# Patient Record
Sex: Male | Born: 1950 | Race: White | Hispanic: No | Marital: Married | Smoking: Never smoker
Health system: Southern US, Community
[De-identification: ages and names within clinical notes are randomized; demographics above are authoritative.]

## PROBLEM LIST (undated history)

## (undated) DIAGNOSIS — M1990 Unspecified osteoarthritis, unspecified site: Secondary | ICD-10-CM

## (undated) DIAGNOSIS — E039 Hypothyroidism, unspecified: Secondary | ICD-10-CM

## (undated) DIAGNOSIS — I10 Essential (primary) hypertension: Secondary | ICD-10-CM

## (undated) DIAGNOSIS — Z974 Presence of external hearing-aid: Secondary | ICD-10-CM

## (undated) DIAGNOSIS — E785 Hyperlipidemia, unspecified: Secondary | ICD-10-CM

## (undated) HISTORY — PX: SKIN CANCER EXCISION: SHX779

## (undated) HISTORY — PX: APPENDECTOMY: SHX54

## (undated) HISTORY — PX: COLONOSCOPY: SHX174

## (undated) HISTORY — PX: BACK SURGERY: SHX140

---

## 2005-05-22 ENCOUNTER — Ambulatory Visit: Payer: Self-pay | Admitting: Unknown Physician Specialty

## 2010-12-06 ENCOUNTER — Ambulatory Visit: Payer: Self-pay | Admitting: Unknown Physician Specialty

## 2013-12-27 ENCOUNTER — Observation Stay: Payer: Self-pay | Admitting: Internal Medicine

## 2013-12-27 LAB — COMPREHENSIVE METABOLIC PANEL
Albumin: 3.6 g/dL (ref 3.4–5.0)
Alkaline Phosphatase: 70 U/L
Anion Gap: 7 (ref 7–16)
BUN: 19 mg/dL — AB (ref 7–18)
Bilirubin,Total: 1.3 mg/dL — ABNORMAL HIGH (ref 0.2–1.0)
CREATININE: 1.38 mg/dL — AB (ref 0.60–1.30)
Calcium, Total: 8.3 mg/dL — ABNORMAL LOW (ref 8.5–10.1)
Chloride: 108 mmol/L — ABNORMAL HIGH (ref 98–107)
Co2: 25 mmol/L (ref 21–32)
EGFR (African American): >60
GFR CALC NON AF AMER: 55 — AB
Glucose: 102 mg/dL — ABNORMAL HIGH (ref 65–99)
OSMOLALITY: 282 (ref 275–301)
Potassium: 3.7 mmol/L (ref 3.5–5.1)
SGOT(AST): 32 U/L (ref 15–37)
SGPT (ALT): 38 U/L
SODIUM: 140 mmol/L (ref 136–145)
Total Protein: 6.8 g/dL (ref 6.4–8.2)

## 2013-12-27 LAB — CBC WITH DIFFERENTIAL/PLATELET
BASOS ABS: 0.1 10*3/uL (ref 0.0–0.1)
Basophil %: 0.7 %
EOS ABS: 0.2 10*3/uL (ref 0.0–0.7)
Eosinophil %: 2.1 %
HCT: 42.5 % (ref 40.0–52.0)
HGB: 14.1 g/dL (ref 13.0–18.0)
LYMPHS ABS: 1.6 10*3/uL (ref 1.0–3.6)
Lymphocyte %: 21.6 %
MCH: 30.9 pg (ref 26.0–34.0)
MCHC: 33.1 g/dL (ref 32.0–36.0)
MCV: 93 fL (ref 80–100)
MONOS PCT: 8.6 %
Monocyte #: 0.6 x10 3/mm (ref 0.2–1.0)
NEUTROS PCT: 67 %
Neutrophil #: 5 10*3/uL (ref 1.4–6.5)
Platelet: 162 10*3/uL (ref 150–440)
RBC: 4.55 10*6/uL (ref 4.40–5.90)
RDW: 13.9 % (ref 11.5–14.5)
WBC: 7.5 10*3/uL (ref 3.8–10.6)

## 2013-12-27 LAB — CK-MB: CK-MB: 5.7 ng/mL — ABNORMAL HIGH (ref 0.5–3.6)

## 2013-12-27 LAB — TROPONIN I: Troponin-I: <0.02 ng/mL

## 2013-12-28 LAB — TROPONIN I: Troponin-I: <0.02 ng/mL

## 2013-12-28 LAB — LIPID PANEL
Cholesterol: 188 mg/dL (ref 0–200)
HDL: 33 mg/dL — AB (ref 40–60)
LDL CHOLESTEROL, CALC: 109 mg/dL — AB (ref 0–100)
Triglycerides: 231 mg/dL — ABNORMAL HIGH (ref 0–200)
VLDL Cholesterol, Calc: 46 mg/dL — ABNORMAL HIGH (ref 5–40)

## 2013-12-28 LAB — CK-MB
CK-MB: 5.2 ng/mL — ABNORMAL HIGH (ref 0.5–3.6)
CK-MB: 5.4 ng/mL — ABNORMAL HIGH (ref 0.5–3.6)

## 2013-12-29 ENCOUNTER — Ambulatory Visit: Payer: Self-pay | Admitting: Cardiology

## 2014-06-13 NOTE — H&P (Signed)
PATIENT NAME:  Don Holder, Don Holder MR#:  [id number] DATE OF BIRTH:  Jan 26, 1951  DATE OF ADMISSION:  12/27/2013  REFERRING PHYSICIAN: Larae Grooms, MD  PRIMARY CARE PHYSICIAN: Rusty Aus, MD, [facility].   CHIEF COMPLAINT: Chest pain.  HISTORY OF PRESENT ILLNESS: A 64 year old Caucasian male with a history of hypertension presenting with chest pain. First episode 1 day prior to arrival. It occurred at rest. Second episode with exertion. Describes both episodes retrosternal location with radiation to the right chest and shoulder, dull pressure in quality, 4 to 5 out of 10 in intensity, associated with shortness of breath. Currently symptom-free at this time. No further symptoms.   REVIEW OF SYSTEMS: CONSTITUTIONAL: Denies fevers, chills, fatigue, weakness.  EYES: Denies blurred vision, double vision, eye pain.  EARS, NOSE, THROAT: Denies tinnitus, ear pain, hearing loss.  RESPIRATORY: Denies cough, wheeze, shortness of breath currently. Did have an episode of shortness of breath with his chest pain.  CARDIOVASCULAR: Chest pain as described above. Denies any orthopnea, edema.  GASTROINTESTINAL: Denies nausea, vomiting, diarrhea, abdominal pain.  GENITOURINARY: Denies dysuria, hematuria.  ENDOCRINE: Denies nocturia or thyroid problems. HEMATOLOGY AND LYMPHATIC: Denies easy bruising and bleeding.  SKIN: Denies rashes or lesions.  MUSCULOSKELETAL: Denies pain in neck, back, shoulder, knees, hips, or arthritic symptoms.  NEUROLOGIC: Denies paralysis, paresthesias.  PSYCHIATRIC: Denies anxiety or depressive symptoms.  Otherwise, full review of systems performed by me is negative.   PAST MEDICAL HISTORY: Hypertension.   SOCIAL HISTORY: Denies any alcohol, tobacco, or drug usage.   FAMILY HISTORY: Positive for coronary artery disease; however, late onset in his father.   ALLERGIES: No known drug allergies.   HOME MEDICATIONS: Include lisinopril 20 mg p.o. q. daily.   PHYSICAL  EXAMINATION:  VITAL SIGNS: Temperature 98.1, heart rate 88, respirations 14, blood pressure 101/72, saturating 96% on room air. Weight 90.1 kg, BMI 30.2.  GENERAL: Well-nourished, well-developed, Caucasian male appearing in no acute distress.  HEAD: Normocephalic, atraumatic.  EYES: Pupils equal, round, reactive to light. Extraocular muscles intact. No scleral icterus.  MOUTH: Moist mucosal membranes. Dentition intact. No abscess noted. EARS, NOSE, THROAT: Clear without exudates. No external lesions.  NECK: Supple. No thyromegaly. No nodules. No JVD.  PULMONARY: Clear to auscultation bilaterally without wheezes, rales, or rhonchi. No use of accessory muscles. Good respiratory effort.  CHEST: Nontender to palpation.  CARDIOVASCULAR: S1, S2, regular rate and rhythm. No murmurs, rubs, or gallops. No edema. Pedal pulses 2+ bilaterally. GASTROINTESTINAL: Soft, nontender, nondistended. No masses. Positive bowel sounds. No hepatosplenomegaly.  MUSCULOSKELETAL: No swelling, clubbing, or edema. Range of motion full in all extremities.  NEUROLOGIC: Cranial nerves II through XII intact. No gross focal neurological deficits. Sensation intact. Reflexes intact.  SKIN: No ulceration, lesions, rash, cyanosis. Skin warm, dry. Turgor intact.  PSYCHIATRIC: Mood and affect within normal limits. Patient awake, alert, oriented x 3. Insight and judgment are intact.   LABORATORY DATA: EKG performed which reveals normal sinus rhythm. No ST or T wave abnormalities. Remainder of laboratory data: Sodium 140, potassium 3.7, chloride 108, bicarbonate 25, BUN 19, creatinine 1.38, glucose 102. LFTs: Bilirubin of 1.3, otherwise within normal limits. Troponin less than 0.02. WBC 7.5, hemoglobin of 14.1, platelets of 162,000.    ASSESSMENT AND PLAN: A 64 year old Caucasian male with a history of hypertension presenting with chest pain.  1.  Chest pain, central. Admitted to telemetry under observational status. Initiate aspirin and  statin therapy. Trend cardiac enzymes x 3.  2.  Hypertension. Lisinopril.  3.  Acute kidney injury. Gentle intravenous fluid hydration. Follow renal function and urine output.  4.  Venous thromboembolism prophylaxis with heparin subcutaneous.  CODE STATUS: Patient is full code.   TIME SPENT: 45 minutes.    ____________________________ Aaron Mose. Jerzie Bieri, MD dkh:ST D: 12/27/2013 23:15:50 ET T: 12/27/2013 23:45:25 ET JOB#: [id number]  cc: Aaron Mose. Everlean Bucher, MD, <Dictator> Murielle Stang Woodfin Ganja MD ELECTRONICALLY SIGNED 12/28/2013 2:20

## 2014-06-13 NOTE — Consult Note (Signed)
   Present Illness 64 yo male with history of htn treated with lisinopril admitted with several day history of right sided chest and arm pain with some doe. He has ruled out for an mi. EKG is normal. States pain began on thrusday of last week and has persisted on and off but mostly on since then. Some doe when ambulating. RF include htn. No dm, tob family hx or lipids.   Physical Exam:  GEN well developed, well nourished, no acute distress   HEENT PERRL, hearing intact to voice   NECK supple   RESP normal resp effort  clear BS   CARD Regular rate and rhythm  Normal, S1, S2  No murmur   ABD denies tenderness  no liver/spleen enlargement   LYMPH negative neck   EXTR negative cyanosis/clubbing, negative edema   SKIN normal to palpation   NEURO cranial nerves intact, motor/sensory function intact   PSYCH A+O to time, place, person   Review of Systems:  Subjective/Chief Complaint chest pain   General: No Complaints   Skin: No Complaints   ENT: No Complaints   Eyes: No Complaints   Neck: No Complaints   Respiratory: No Complaints   Cardiovascular: Chest pain or discomfort  Dyspnea   Genitourinary: No Complaints   Musculoskeletal: No Complaints   Neurologic: No Complaints   Hematologic: No Complaints   Endocrine: No Complaints   Psychiatric: No Complaints   Review of Systems: All other systems were reviewed and found to be negative   Medications/Allergies Reviewed Medications/Allergies reviewed   Family & Social History:  Family and Social History:  Family History Non-Contributory   Social History negative tobacco   Place of Living Home   EKG:  EKG Nml  NSR    Penicillin: Do NOT Use   Impression Pt with htn admitted with atypical chest pain. Ruled out for an mi. EKG and cxr normal. Atypical for angina. WIll ambulate and if stable discharge to home for out patient sestimibi in am.   Plan 1.; Contninue lisinopril and asa 2. OK for discharge form  cardiac standpoint 3. Will set up outpatient sestimibi in am 4. Further recs based on course and sestimibi outcome.   Electronic Signatures: Teodoro Spray (MD)  (Signed [phone] 10:32)  Authored: General Aspect/Present Illness, History and Physical Exam, Review of System, Family & Social History, EKG , Allergies, Impression/Plan   Last Updated: 08-Nov-15 10:32 by Teodoro Spray (MD)

## 2014-06-13 NOTE — Discharge Summary (Signed)
PATIENT NAME:  Don Holder, Don Holder MR#:  [id number] DATE OF BIRTH:  1950/05/10  DATE OF ADMISSION:  12/27/2013 DATE OF DISCHARGE:  12/28/2013  DISCHARGE DIAGNOSES: 1.  Atypical chest pain.  2.  Hypertension.  3.  Acute renal insufficiency.   CHIEF COMPLAINT: Chest pain.   HISTORY OF PRESENT ILLNESS: Don Holder is a 64 year old gentleman with a history of hypertension who presented to the ED complaining of chest pain. The patient reportedly had his first episode a few days back and he described it as a dull pressure-like quality, mainly in the right side of the chest and shoulder. 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 The patient also subsequently developed an episode of shortness of breath when he was walking up hill.   PAST MEDICAL HISTORY: Significant for hypertension. Please see H and P for further details.   HOSPITAL COURSE: The patient was admitted to [facility]. EKG showed normal sinus rhythm with no ST-T changes. Troponin less than 0.02. The patient was chest pain free. He was seen by cardiologist, Dr. Ubaldo Glassing, who recommended that he have a sestamibi Myoview stress test as an outpatient. The patient was ambulated and appeared stable at the time of discharge. Cholesterol level was 188, triglycerides 231 with a troponin less than 0.02, CPK was 5.7 and CPK MB of 5.2. The patient will follow up with Dr. Sabra Heck and also have a sestamibi stress test, that is being as scheduled for him by cardiologist, Dr. Ubaldo Glassing, in a.m.   MEDICATIONS ON DISCHARGE: Lisinopril 20 mg once a day, atorvastatin 20 mg once a day and aspirin 325 mg once a day.   DISCHARGE INSTRUCTIONS: The patient is advised low sodium diet and follow up with Dr. Sabra Heck in 1-2 weeks' time.    Total time spent in discharge of pt:35 minutes  ____________________________ Tracie Harrier, MD vh:TT D: 12/28/2013 11:42:31 ET T: 12/28/2013 15:17:31  ET JOB#: [id number]  cc: Tracie Harrier, MD, <Dictator> Tracie Harrier MD ELECTRONICALLY SIGNED 12/31/2013 13:09

## 2015-07-27 DIAGNOSIS — M25511 Pain in right shoulder: Secondary | ICD-10-CM | POA: Diagnosis not present

## 2015-11-01 DIAGNOSIS — M7521 Bicipital tendinitis, right shoulder: Secondary | ICD-10-CM | POA: Diagnosis not present

## 2015-11-12 DIAGNOSIS — Z85828 Personal history of other malignant neoplasm of skin: Secondary | ICD-10-CM | POA: Diagnosis not present

## 2015-11-12 DIAGNOSIS — L570 Actinic keratosis: Secondary | ICD-10-CM | POA: Diagnosis not present

## 2015-11-12 DIAGNOSIS — X32XXXA Exposure to sunlight, initial encounter: Secondary | ICD-10-CM | POA: Diagnosis not present

## 2015-11-12 DIAGNOSIS — B078 Other viral warts: Secondary | ICD-10-CM | POA: Diagnosis not present

## 2015-11-12 DIAGNOSIS — D1801 Hemangioma of skin and subcutaneous tissue: Secondary | ICD-10-CM | POA: Diagnosis not present

## 2015-11-12 DIAGNOSIS — L814 Other melanin hyperpigmentation: Secondary | ICD-10-CM | POA: Diagnosis not present

## 2015-12-21 IMAGING — CR DG CHEST 1V PORT
1 series · 1 of 1 positions shown · non-contrast
Comparison: None.

CLINICAL DATA: Shortness of breath for 1 week on 2 L oxygen by
nasal cannula. Midsternal chest pain radiating to the right.

EXAM:
PORTABLE CHEST - 1 VIEW

[ap]
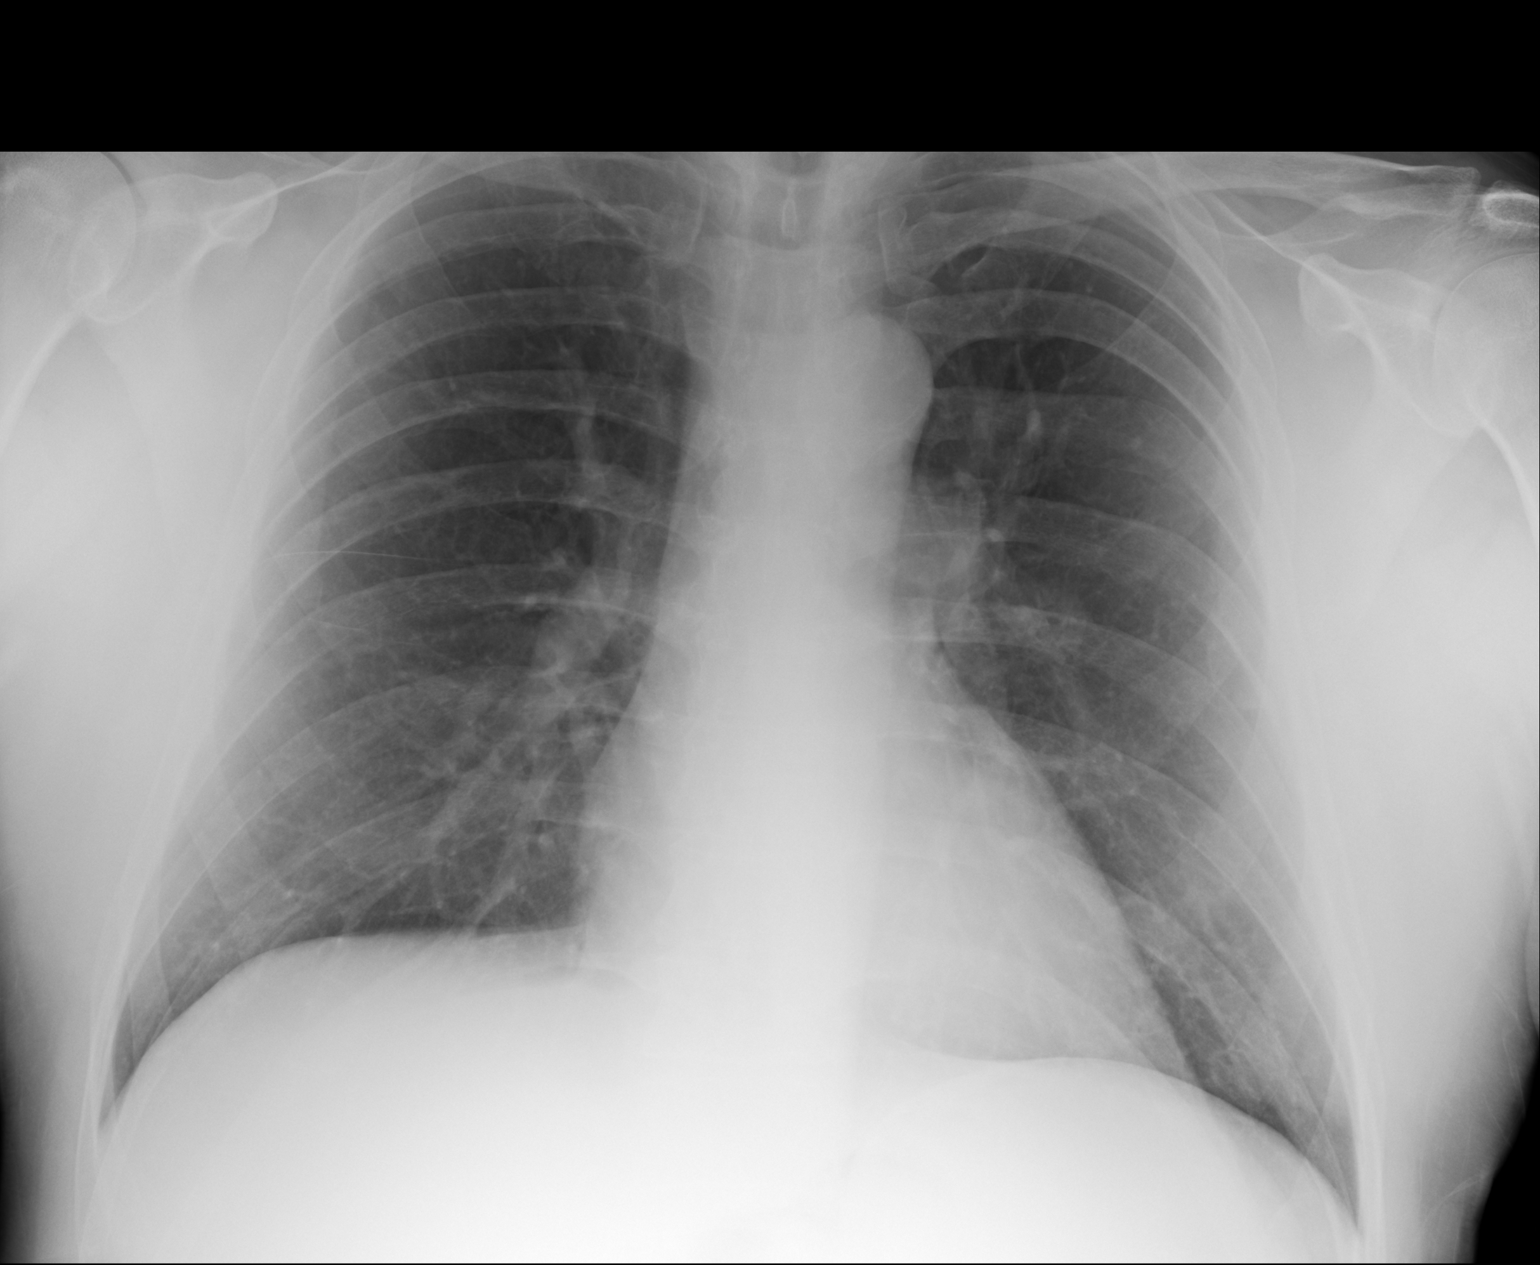

[1 of 1 positions shown; findings below may reference images not displayed]

FINDINGS: The heart size and mediastinal contours are within normal limits.
Both lungs are clear. The visualized skeletal structures are
unremarkable.
IMPRESSION: No active disease.

## 2015-12-29 DIAGNOSIS — M79671 Pain in right foot: Secondary | ICD-10-CM | POA: Diagnosis not present

## 2015-12-29 DIAGNOSIS — M898X9 Other specified disorders of bone, unspecified site: Secondary | ICD-10-CM | POA: Diagnosis not present

## 2015-12-29 DIAGNOSIS — M79672 Pain in left foot: Secondary | ICD-10-CM | POA: Diagnosis not present

## 2015-12-29 DIAGNOSIS — D2370 Other benign neoplasm of skin of unspecified lower limb, including hip: Secondary | ICD-10-CM | POA: Diagnosis not present

## 2016-03-10 DIAGNOSIS — L814 Other melanin hyperpigmentation: Secondary | ICD-10-CM | POA: Diagnosis not present

## 2016-03-10 DIAGNOSIS — L821 Other seborrheic keratosis: Secondary | ICD-10-CM | POA: Diagnosis not present

## 2016-03-10 DIAGNOSIS — Z85828 Personal history of other malignant neoplasm of skin: Secondary | ICD-10-CM | POA: Diagnosis not present

## 2016-03-10 DIAGNOSIS — X32XXXA Exposure to sunlight, initial encounter: Secondary | ICD-10-CM | POA: Diagnosis not present

## 2016-03-10 DIAGNOSIS — L570 Actinic keratosis: Secondary | ICD-10-CM | POA: Diagnosis not present

## 2016-04-26 DIAGNOSIS — M659 Synovitis and tenosynovitis, unspecified: Secondary | ICD-10-CM | POA: Diagnosis not present

## 2016-04-26 DIAGNOSIS — Z0000 Encounter for general adult medical examination without abnormal findings: Secondary | ICD-10-CM | POA: Diagnosis not present

## 2016-04-26 DIAGNOSIS — Z125 Encounter for screening for malignant neoplasm of prostate: Secondary | ICD-10-CM | POA: Diagnosis not present

## 2016-04-26 DIAGNOSIS — E538 Deficiency of other specified B group vitamins: Secondary | ICD-10-CM | POA: Diagnosis not present

## 2016-06-20 DIAGNOSIS — E538 Deficiency of other specified B group vitamins: Secondary | ICD-10-CM | POA: Diagnosis not present

## 2016-07-25 DIAGNOSIS — D485 Neoplasm of uncertain behavior of skin: Secondary | ICD-10-CM | POA: Diagnosis not present

## 2016-07-25 DIAGNOSIS — X32XXXA Exposure to sunlight, initial encounter: Secondary | ICD-10-CM | POA: Diagnosis not present

## 2016-07-25 DIAGNOSIS — Z08 Encounter for follow-up examination after completed treatment for malignant neoplasm: Secondary | ICD-10-CM | POA: Diagnosis not present

## 2016-07-25 DIAGNOSIS — L570 Actinic keratosis: Secondary | ICD-10-CM | POA: Diagnosis not present

## 2016-07-25 DIAGNOSIS — C44612 Basal cell carcinoma of skin of right upper limb, including shoulder: Secondary | ICD-10-CM | POA: Diagnosis not present

## 2016-07-25 DIAGNOSIS — Z85828 Personal history of other malignant neoplasm of skin: Secondary | ICD-10-CM | POA: Diagnosis not present

## 2016-08-21 DIAGNOSIS — E538 Deficiency of other specified B group vitamins: Secondary | ICD-10-CM | POA: Diagnosis not present

## 2016-08-30 DIAGNOSIS — E538 Deficiency of other specified B group vitamins: Secondary | ICD-10-CM | POA: Diagnosis not present

## 2016-09-05 DIAGNOSIS — C44329 Squamous cell carcinoma of skin of other parts of face: Secondary | ICD-10-CM | POA: Diagnosis not present

## 2016-09-05 DIAGNOSIS — D485 Neoplasm of uncertain behavior of skin: Secondary | ICD-10-CM | POA: Diagnosis not present

## 2016-09-05 DIAGNOSIS — L905 Scar conditions and fibrosis of skin: Secondary | ICD-10-CM | POA: Diagnosis not present

## 2016-09-05 DIAGNOSIS — C44612 Basal cell carcinoma of skin of right upper limb, including shoulder: Secondary | ICD-10-CM | POA: Diagnosis not present

## 2016-10-11 DIAGNOSIS — D0439 Carcinoma in situ of skin of other parts of face: Secondary | ICD-10-CM | POA: Diagnosis not present

## 2016-10-11 DIAGNOSIS — C44329 Squamous cell carcinoma of skin of other parts of face: Secondary | ICD-10-CM | POA: Diagnosis not present

## 2016-10-11 DIAGNOSIS — L905 Scar conditions and fibrosis of skin: Secondary | ICD-10-CM | POA: Diagnosis not present

## 2016-10-12 DIAGNOSIS — M7042 Prepatellar bursitis, left knee: Secondary | ICD-10-CM | POA: Diagnosis not present

## 2016-10-30 DIAGNOSIS — M7052 Other bursitis of knee, left knee: Secondary | ICD-10-CM | POA: Diagnosis not present

## 2016-10-30 DIAGNOSIS — I10 Essential (primary) hypertension: Secondary | ICD-10-CM | POA: Diagnosis not present

## 2016-11-15 DIAGNOSIS — M76899 Other specified enthesopathies of unspecified lower limb, excluding foot: Secondary | ICD-10-CM | POA: Diagnosis not present

## 2016-11-15 DIAGNOSIS — I10 Essential (primary) hypertension: Secondary | ICD-10-CM | POA: Diagnosis not present

## 2016-11-15 DIAGNOSIS — Z23 Encounter for immunization: Secondary | ICD-10-CM | POA: Diagnosis not present

## 2016-11-15 DIAGNOSIS — Z0000 Encounter for general adult medical examination without abnormal findings: Secondary | ICD-10-CM | POA: Diagnosis not present

## 2016-11-17 ENCOUNTER — Other Ambulatory Visit: Payer: Self-pay | Admitting: Sports Medicine

## 2016-11-17 DIAGNOSIS — M25462 Effusion, left knee: Secondary | ICD-10-CM

## 2016-11-17 DIAGNOSIS — M25562 Pain in left knee: Principal | ICD-10-CM

## 2016-11-17 DIAGNOSIS — G8929 Other chronic pain: Secondary | ICD-10-CM

## 2016-11-24 ENCOUNTER — Ambulatory Visit
Admission: RE | Admit: 2016-11-24 | Discharge: 2016-11-24 | Disposition: A | Discharge status: Home / Self Care | Payer: PPO | Source: Ambulatory Visit | Attending: Sports Medicine | Admitting: Sports Medicine

## 2016-11-24 ENCOUNTER — Other Ambulatory Visit: Payer: Self-pay | Admitting: Sports Medicine

## 2016-11-24 DIAGNOSIS — G8929 Other chronic pain: Secondary | ICD-10-CM | POA: Insufficient documentation

## 2016-11-24 DIAGNOSIS — M25462 Effusion, left knee: Secondary | ICD-10-CM

## 2016-11-24 DIAGNOSIS — M948X6 Other specified disorders of cartilage, lower leg: Secondary | ICD-10-CM | POA: Insufficient documentation

## 2016-11-24 DIAGNOSIS — S83232A Complex tear of medial meniscus, current injury, left knee, initial encounter: Secondary | ICD-10-CM | POA: Diagnosis not present

## 2016-11-24 DIAGNOSIS — M25562 Pain in left knee: Secondary | ICD-10-CM | POA: Diagnosis not present

## 2016-11-24 DIAGNOSIS — S83242A Other tear of medial meniscus, current injury, left knee, initial encounter: Secondary | ICD-10-CM | POA: Diagnosis not present

## 2017-02-21 DIAGNOSIS — H2513 Age-related nuclear cataract, bilateral: Secondary | ICD-10-CM | POA: Diagnosis not present

## 2017-04-23 DIAGNOSIS — E538 Deficiency of other specified B group vitamins: Secondary | ICD-10-CM | POA: Diagnosis not present

## 2017-04-23 DIAGNOSIS — Z125 Encounter for screening for malignant neoplasm of prostate: Secondary | ICD-10-CM | POA: Diagnosis not present

## 2017-04-23 DIAGNOSIS — Z0000 Encounter for general adult medical examination without abnormal findings: Secondary | ICD-10-CM | POA: Diagnosis not present

## 2017-04-30 DIAGNOSIS — Z79899 Other long term (current) drug therapy: Secondary | ICD-10-CM | POA: Diagnosis not present

## 2017-04-30 DIAGNOSIS — R739 Hyperglycemia, unspecified: Secondary | ICD-10-CM | POA: Diagnosis not present

## 2017-04-30 DIAGNOSIS — Z0000 Encounter for general adult medical examination without abnormal findings: Secondary | ICD-10-CM | POA: Diagnosis not present

## 2017-04-30 DIAGNOSIS — E538 Deficiency of other specified B group vitamins: Secondary | ICD-10-CM | POA: Diagnosis not present

## 2017-04-30 DIAGNOSIS — Z23 Encounter for immunization: Secondary | ICD-10-CM | POA: Diagnosis not present

## 2017-04-30 DIAGNOSIS — Z125 Encounter for screening for malignant neoplasm of prostate: Secondary | ICD-10-CM | POA: Diagnosis not present

## 2017-05-16 DIAGNOSIS — J0100 Acute maxillary sinusitis, unspecified: Secondary | ICD-10-CM | POA: Diagnosis not present

## 2017-06-04 DIAGNOSIS — C44329 Squamous cell carcinoma of skin of other parts of face: Secondary | ICD-10-CM | POA: Diagnosis not present

## 2017-06-04 DIAGNOSIS — D2261 Melanocytic nevi of right upper limb, including shoulder: Secondary | ICD-10-CM | POA: Diagnosis not present

## 2017-06-04 DIAGNOSIS — D2272 Melanocytic nevi of left lower limb, including hip: Secondary | ICD-10-CM | POA: Diagnosis not present

## 2017-06-04 DIAGNOSIS — L821 Other seborrheic keratosis: Secondary | ICD-10-CM | POA: Diagnosis not present

## 2017-06-04 DIAGNOSIS — D2271 Melanocytic nevi of right lower limb, including hip: Secondary | ICD-10-CM | POA: Diagnosis not present

## 2017-06-04 DIAGNOSIS — L570 Actinic keratosis: Secondary | ICD-10-CM | POA: Diagnosis not present

## 2017-06-04 DIAGNOSIS — X32XXXA Exposure to sunlight, initial encounter: Secondary | ICD-10-CM | POA: Diagnosis not present

## 2017-06-04 DIAGNOSIS — B372 Candidiasis of skin and nail: Secondary | ICD-10-CM | POA: Diagnosis not present

## 2017-06-04 DIAGNOSIS — D485 Neoplasm of uncertain behavior of skin: Secondary | ICD-10-CM | POA: Diagnosis not present

## 2017-06-04 DIAGNOSIS — D2262 Melanocytic nevi of left upper limb, including shoulder: Secondary | ICD-10-CM | POA: Diagnosis not present

## 2017-06-04 DIAGNOSIS — D1801 Hemangioma of skin and subcutaneous tissue: Secondary | ICD-10-CM | POA: Diagnosis not present

## 2017-08-02 DIAGNOSIS — C44329 Squamous cell carcinoma of skin of other parts of face: Secondary | ICD-10-CM | POA: Diagnosis not present

## 2017-08-20 DIAGNOSIS — M545 Low back pain: Secondary | ICD-10-CM | POA: Diagnosis not present

## 2017-12-26 DIAGNOSIS — M722 Plantar fascial fibromatosis: Secondary | ICD-10-CM | POA: Diagnosis not present

## 2018-01-14 DIAGNOSIS — X32XXXA Exposure to sunlight, initial encounter: Secondary | ICD-10-CM | POA: Diagnosis not present

## 2018-01-14 DIAGNOSIS — B372 Candidiasis of skin and nail: Secondary | ICD-10-CM | POA: Diagnosis not present

## 2018-01-14 DIAGNOSIS — L738 Other specified follicular disorders: Secondary | ICD-10-CM | POA: Diagnosis not present

## 2018-01-14 DIAGNOSIS — D485 Neoplasm of uncertain behavior of skin: Secondary | ICD-10-CM | POA: Diagnosis not present

## 2018-01-14 DIAGNOSIS — L570 Actinic keratosis: Secondary | ICD-10-CM | POA: Diagnosis not present

## 2018-01-14 DIAGNOSIS — Z08 Encounter for follow-up examination after completed treatment for malignant neoplasm: Secondary | ICD-10-CM | POA: Diagnosis not present

## 2018-01-14 DIAGNOSIS — Z85828 Personal history of other malignant neoplasm of skin: Secondary | ICD-10-CM | POA: Diagnosis not present

## 2018-01-14 DIAGNOSIS — C44519 Basal cell carcinoma of skin of other part of trunk: Secondary | ICD-10-CM | POA: Diagnosis not present

## 2018-01-15 DIAGNOSIS — M722 Plantar fascial fibromatosis: Secondary | ICD-10-CM | POA: Diagnosis not present

## 2018-01-30 DIAGNOSIS — Z23 Encounter for immunization: Secondary | ICD-10-CM | POA: Diagnosis not present

## 2018-02-15 DIAGNOSIS — C44519 Basal cell carcinoma of skin of other part of trunk: Secondary | ICD-10-CM | POA: Diagnosis not present

## 2018-04-03 DIAGNOSIS — S93312A Subluxation of tarsal joint of left foot, initial encounter: Secondary | ICD-10-CM | POA: Diagnosis not present

## 2018-04-24 DIAGNOSIS — S93312A Subluxation of tarsal joint of left foot, initial encounter: Secondary | ICD-10-CM | POA: Diagnosis not present

## 2018-04-24 DIAGNOSIS — M76812 Anterior tibial syndrome, left leg: Secondary | ICD-10-CM | POA: Diagnosis not present

## 2018-04-29 DIAGNOSIS — Z125 Encounter for screening for malignant neoplasm of prostate: Secondary | ICD-10-CM | POA: Diagnosis not present

## 2018-04-29 DIAGNOSIS — R739 Hyperglycemia, unspecified: Secondary | ICD-10-CM | POA: Diagnosis not present

## 2018-04-29 DIAGNOSIS — Z79899 Other long term (current) drug therapy: Secondary | ICD-10-CM | POA: Diagnosis not present

## 2018-04-29 DIAGNOSIS — E538 Deficiency of other specified B group vitamins: Secondary | ICD-10-CM | POA: Diagnosis not present

## 2018-05-06 DIAGNOSIS — R739 Hyperglycemia, unspecified: Secondary | ICD-10-CM | POA: Diagnosis not present

## 2018-05-06 DIAGNOSIS — Z125 Encounter for screening for malignant neoplasm of prostate: Secondary | ICD-10-CM | POA: Diagnosis not present

## 2018-05-06 DIAGNOSIS — Z0000 Encounter for general adult medical examination without abnormal findings: Secondary | ICD-10-CM | POA: Diagnosis not present

## 2018-05-06 DIAGNOSIS — E538 Deficiency of other specified B group vitamins: Secondary | ICD-10-CM | POA: Diagnosis not present

## 2018-05-06 DIAGNOSIS — E782 Mixed hyperlipidemia: Secondary | ICD-10-CM | POA: Diagnosis not present

## 2018-10-30 DIAGNOSIS — I10 Essential (primary) hypertension: Secondary | ICD-10-CM | POA: Diagnosis not present

## 2018-10-30 DIAGNOSIS — R739 Hyperglycemia, unspecified: Secondary | ICD-10-CM | POA: Diagnosis not present

## 2018-11-06 DIAGNOSIS — R739 Hyperglycemia, unspecified: Secondary | ICD-10-CM | POA: Diagnosis not present

## 2018-11-06 DIAGNOSIS — I10 Essential (primary) hypertension: Secondary | ICD-10-CM | POA: Diagnosis not present

## 2018-12-05 DIAGNOSIS — X32XXXA Exposure to sunlight, initial encounter: Secondary | ICD-10-CM | POA: Diagnosis not present

## 2018-12-05 DIAGNOSIS — Z08 Encounter for follow-up examination after completed treatment for malignant neoplasm: Secondary | ICD-10-CM | POA: Diagnosis not present

## 2018-12-05 DIAGNOSIS — L570 Actinic keratosis: Secondary | ICD-10-CM | POA: Diagnosis not present

## 2018-12-05 DIAGNOSIS — Z85828 Personal history of other malignant neoplasm of skin: Secondary | ICD-10-CM | POA: Diagnosis not present

## 2018-12-05 DIAGNOSIS — D485 Neoplasm of uncertain behavior of skin: Secondary | ICD-10-CM | POA: Diagnosis not present

## 2018-12-06 DIAGNOSIS — Z23 Encounter for immunization: Secondary | ICD-10-CM | POA: Diagnosis not present

## 2019-04-21 DIAGNOSIS — H52223 Regular astigmatism, bilateral: Secondary | ICD-10-CM | POA: Diagnosis not present

## 2019-04-21 DIAGNOSIS — H5203 Hypermetropia, bilateral: Secondary | ICD-10-CM | POA: Diagnosis not present

## 2019-04-21 DIAGNOSIS — H524 Presbyopia: Secondary | ICD-10-CM | POA: Diagnosis not present

## 2019-04-21 DIAGNOSIS — H2513 Age-related nuclear cataract, bilateral: Secondary | ICD-10-CM | POA: Diagnosis not present

## 2019-04-30 DIAGNOSIS — E782 Mixed hyperlipidemia: Secondary | ICD-10-CM | POA: Diagnosis not present

## 2019-04-30 DIAGNOSIS — E538 Deficiency of other specified B group vitamins: Secondary | ICD-10-CM | POA: Diagnosis not present

## 2019-04-30 DIAGNOSIS — Z125 Encounter for screening for malignant neoplasm of prostate: Secondary | ICD-10-CM | POA: Diagnosis not present

## 2019-05-07 DIAGNOSIS — Z125 Encounter for screening for malignant neoplasm of prostate: Secondary | ICD-10-CM | POA: Diagnosis not present

## 2019-05-07 DIAGNOSIS — R739 Hyperglycemia, unspecified: Secondary | ICD-10-CM | POA: Diagnosis not present

## 2019-05-07 DIAGNOSIS — Z0000 Encounter for general adult medical examination without abnormal findings: Secondary | ICD-10-CM | POA: Diagnosis not present

## 2019-05-07 DIAGNOSIS — E782 Mixed hyperlipidemia: Secondary | ICD-10-CM | POA: Diagnosis not present

## 2019-05-07 DIAGNOSIS — E538 Deficiency of other specified B group vitamins: Secondary | ICD-10-CM | POA: Diagnosis not present

## 2019-06-02 DIAGNOSIS — S6991XA Unspecified injury of right wrist, hand and finger(s), initial encounter: Secondary | ICD-10-CM | POA: Diagnosis not present

## 2019-06-02 DIAGNOSIS — L089 Local infection of the skin and subcutaneous tissue, unspecified: Secondary | ICD-10-CM | POA: Diagnosis not present

## 2019-06-02 DIAGNOSIS — L03011 Cellulitis of right finger: Secondary | ICD-10-CM | POA: Diagnosis not present

## 2019-06-02 DIAGNOSIS — S61200A Unspecified open wound of right index finger without damage to nail, initial encounter: Secondary | ICD-10-CM | POA: Diagnosis not present

## 2019-06-02 DIAGNOSIS — S61209A Unspecified open wound of unspecified finger without damage to nail, initial encounter: Secondary | ICD-10-CM | POA: Diagnosis not present

## 2019-06-06 DIAGNOSIS — L089 Local infection of the skin and subcutaneous tissue, unspecified: Secondary | ICD-10-CM | POA: Diagnosis not present

## 2019-07-01 DIAGNOSIS — R739 Hyperglycemia, unspecified: Secondary | ICD-10-CM | POA: Diagnosis not present

## 2019-09-01 DIAGNOSIS — M79674 Pain in right toe(s): Secondary | ICD-10-CM | POA: Diagnosis not present

## 2019-09-01 DIAGNOSIS — M898X9 Other specified disorders of bone, unspecified site: Secondary | ICD-10-CM | POA: Diagnosis not present

## 2019-09-01 DIAGNOSIS — M79675 Pain in left toe(s): Secondary | ICD-10-CM | POA: Diagnosis not present

## 2019-09-29 DIAGNOSIS — L988 Other specified disorders of the skin and subcutaneous tissue: Secondary | ICD-10-CM | POA: Diagnosis not present

## 2019-09-29 DIAGNOSIS — D2272 Melanocytic nevi of left lower limb, including hip: Secondary | ICD-10-CM | POA: Diagnosis not present

## 2019-09-29 DIAGNOSIS — Z01810 Encounter for preprocedural cardiovascular examination: Secondary | ICD-10-CM | POA: Diagnosis not present

## 2019-09-29 DIAGNOSIS — D2262 Melanocytic nevi of left upper limb, including shoulder: Secondary | ICD-10-CM | POA: Diagnosis not present

## 2019-09-29 DIAGNOSIS — D2271 Melanocytic nevi of right lower limb, including hip: Secondary | ICD-10-CM | POA: Diagnosis not present

## 2019-09-29 DIAGNOSIS — D2261 Melanocytic nevi of right upper limb, including shoulder: Secondary | ICD-10-CM | POA: Diagnosis not present

## 2019-09-29 DIAGNOSIS — D225 Melanocytic nevi of trunk: Secondary | ICD-10-CM | POA: Diagnosis not present

## 2019-09-29 DIAGNOSIS — Z85828 Personal history of other malignant neoplasm of skin: Secondary | ICD-10-CM | POA: Diagnosis not present

## 2019-09-29 DIAGNOSIS — L570 Actinic keratosis: Secondary | ICD-10-CM | POA: Diagnosis not present

## 2019-09-29 DIAGNOSIS — X32XXXA Exposure to sunlight, initial encounter: Secondary | ICD-10-CM | POA: Diagnosis not present

## 2019-09-29 DIAGNOSIS — I10 Essential (primary) hypertension: Secondary | ICD-10-CM | POA: Diagnosis not present

## 2019-10-28 ENCOUNTER — Other Ambulatory Visit: Payer: PPO

## 2019-10-30 ENCOUNTER — Ambulatory Visit: Admit: 2019-10-30 | Payer: PPO | Admitting: Podiatry

## 2019-10-30 SURGERY — BONE EXCISION
Anesthesia: Choice | Laterality: Bilateral

## 2020-02-26 DIAGNOSIS — M79674 Pain in right toe(s): Secondary | ICD-10-CM | POA: Diagnosis not present

## 2020-02-26 DIAGNOSIS — M79675 Pain in left toe(s): Secondary | ICD-10-CM | POA: Diagnosis not present

## 2020-02-26 DIAGNOSIS — M2041 Other hammer toe(s) (acquired), right foot: Secondary | ICD-10-CM | POA: Diagnosis not present

## 2020-02-26 DIAGNOSIS — M2042 Other hammer toe(s) (acquired), left foot: Secondary | ICD-10-CM | POA: Diagnosis not present

## 2020-02-26 DIAGNOSIS — M898X9 Other specified disorders of bone, unspecified site: Secondary | ICD-10-CM | POA: Diagnosis not present

## 2020-05-03 DIAGNOSIS — R739 Hyperglycemia, unspecified: Secondary | ICD-10-CM | POA: Diagnosis not present

## 2020-05-03 DIAGNOSIS — Z125 Encounter for screening for malignant neoplasm of prostate: Secondary | ICD-10-CM | POA: Diagnosis not present

## 2020-05-03 DIAGNOSIS — E782 Mixed hyperlipidemia: Secondary | ICD-10-CM | POA: Diagnosis not present

## 2020-05-03 DIAGNOSIS — E538 Deficiency of other specified B group vitamins: Secondary | ICD-10-CM | POA: Diagnosis not present

## 2020-05-10 DIAGNOSIS — Z125 Encounter for screening for malignant neoplasm of prostate: Secondary | ICD-10-CM | POA: Diagnosis not present

## 2020-05-10 DIAGNOSIS — E782 Mixed hyperlipidemia: Secondary | ICD-10-CM | POA: Diagnosis not present

## 2020-05-10 DIAGNOSIS — E039 Hypothyroidism, unspecified: Secondary | ICD-10-CM | POA: Diagnosis not present

## 2020-05-10 DIAGNOSIS — Z0000 Encounter for general adult medical examination without abnormal findings: Secondary | ICD-10-CM | POA: Diagnosis not present

## 2020-05-10 DIAGNOSIS — E538 Deficiency of other specified B group vitamins: Secondary | ICD-10-CM | POA: Diagnosis not present

## 2020-05-10 DIAGNOSIS — R739 Hyperglycemia, unspecified: Secondary | ICD-10-CM | POA: Diagnosis not present

## 2020-05-11 DIAGNOSIS — B349 Viral infection, unspecified: Secondary | ICD-10-CM | POA: Diagnosis not present

## 2020-05-11 DIAGNOSIS — J208 Acute bronchitis due to other specified organisms: Secondary | ICD-10-CM | POA: Diagnosis not present

## 2020-05-11 DIAGNOSIS — U071 COVID-19: Secondary | ICD-10-CM | POA: Diagnosis not present

## 2020-05-12 DIAGNOSIS — U071 COVID-19: Secondary | ICD-10-CM | POA: Diagnosis not present

## 2020-05-12 DIAGNOSIS — J208 Acute bronchitis due to other specified organisms: Secondary | ICD-10-CM | POA: Diagnosis not present

## 2020-05-18 DIAGNOSIS — J208 Acute bronchitis due to other specified organisms: Secondary | ICD-10-CM | POA: Diagnosis not present

## 2020-05-18 DIAGNOSIS — U071 COVID-19: Secondary | ICD-10-CM | POA: Diagnosis not present

## 2020-05-18 DIAGNOSIS — I10 Essential (primary) hypertension: Secondary | ICD-10-CM | POA: Diagnosis not present

## 2020-06-28 DIAGNOSIS — D2371 Other benign neoplasm of skin of right lower limb, including hip: Secondary | ICD-10-CM | POA: Diagnosis not present

## 2020-06-28 DIAGNOSIS — C44519 Basal cell carcinoma of skin of other part of trunk: Secondary | ICD-10-CM | POA: Diagnosis not present

## 2020-06-28 DIAGNOSIS — Z85828 Personal history of other malignant neoplasm of skin: Secondary | ICD-10-CM | POA: Diagnosis not present

## 2020-06-28 DIAGNOSIS — X32XXXA Exposure to sunlight, initial encounter: Secondary | ICD-10-CM | POA: Diagnosis not present

## 2020-06-28 DIAGNOSIS — D485 Neoplasm of uncertain behavior of skin: Secondary | ICD-10-CM | POA: Diagnosis not present

## 2020-06-28 DIAGNOSIS — L570 Actinic keratosis: Secondary | ICD-10-CM | POA: Diagnosis not present

## 2020-06-28 DIAGNOSIS — Z08 Encounter for follow-up examination after completed treatment for malignant neoplasm: Secondary | ICD-10-CM | POA: Diagnosis not present

## 2020-07-01 DIAGNOSIS — Z86010 Personal history of colonic polyps: Secondary | ICD-10-CM | POA: Diagnosis not present

## 2020-07-29 DIAGNOSIS — H52223 Regular astigmatism, bilateral: Secondary | ICD-10-CM | POA: Diagnosis not present

## 2020-07-29 DIAGNOSIS — H43812 Vitreous degeneration, left eye: Secondary | ICD-10-CM | POA: Diagnosis not present

## 2020-07-29 DIAGNOSIS — H524 Presbyopia: Secondary | ICD-10-CM | POA: Diagnosis not present

## 2020-07-29 DIAGNOSIS — H2513 Age-related nuclear cataract, bilateral: Secondary | ICD-10-CM | POA: Diagnosis not present

## 2020-07-29 DIAGNOSIS — H02831 Dermatochalasis of right upper eyelid: Secondary | ICD-10-CM | POA: Diagnosis not present

## 2020-07-29 DIAGNOSIS — H53452 Other localized visual field defect, left eye: Secondary | ICD-10-CM | POA: Diagnosis not present

## 2020-07-29 DIAGNOSIS — H02834 Dermatochalasis of left upper eyelid: Secondary | ICD-10-CM | POA: Diagnosis not present

## 2020-07-29 DIAGNOSIS — H5203 Hypermetropia, bilateral: Secondary | ICD-10-CM | POA: Diagnosis not present

## 2020-08-19 DIAGNOSIS — E039 Hypothyroidism, unspecified: Secondary | ICD-10-CM | POA: Diagnosis not present

## 2020-08-19 DIAGNOSIS — C44519 Basal cell carcinoma of skin of other part of trunk: Secondary | ICD-10-CM | POA: Diagnosis not present

## 2020-09-21 DIAGNOSIS — H25013 Cortical age-related cataract, bilateral: Secondary | ICD-10-CM | POA: Diagnosis not present

## 2020-09-21 DIAGNOSIS — H2513 Age-related nuclear cataract, bilateral: Secondary | ICD-10-CM | POA: Diagnosis not present

## 2020-09-21 DIAGNOSIS — H18413 Arcus senilis, bilateral: Secondary | ICD-10-CM | POA: Diagnosis not present

## 2020-09-21 DIAGNOSIS — H25043 Posterior subcapsular polar age-related cataract, bilateral: Secondary | ICD-10-CM | POA: Diagnosis not present

## 2020-09-21 DIAGNOSIS — H2511 Age-related nuclear cataract, right eye: Secondary | ICD-10-CM | POA: Diagnosis not present

## 2020-10-01 ENCOUNTER — Encounter: Payer: Self-pay | Admitting: *Deleted

## 2020-10-04 ENCOUNTER — Encounter
Admission: RE | Disposition: A | Discharge status: Home / Self Care | Payer: Self-pay | Source: Home / Self Care | Attending: Gastroenterology

## 2020-10-04 ENCOUNTER — Other Ambulatory Visit: Payer: Self-pay

## 2020-10-04 ENCOUNTER — Encounter: Payer: Self-pay | Admitting: Anesthesiology

## 2020-10-04 ENCOUNTER — Ambulatory Visit: Payer: PPO | Admitting: Anesthesiology

## 2020-10-04 ENCOUNTER — Ambulatory Visit
Admission: RE | Admit: 2020-10-04 | Discharge: 2020-10-04 | Disposition: A | Discharge status: Home / Self Care | Payer: PPO | Attending: Gastroenterology | Admitting: Gastroenterology

## 2020-10-04 DIAGNOSIS — D123 Benign neoplasm of transverse colon: Secondary | ICD-10-CM | POA: Diagnosis not present

## 2020-10-04 DIAGNOSIS — Z1211 Encounter for screening for malignant neoplasm of colon: Secondary | ICD-10-CM | POA: Insufficient documentation

## 2020-10-04 DIAGNOSIS — Z880 Allergy status to penicillin: Secondary | ICD-10-CM | POA: Insufficient documentation

## 2020-10-04 DIAGNOSIS — Z888 Allergy status to other drugs, medicaments and biological substances status: Secondary | ICD-10-CM | POA: Diagnosis not present

## 2020-10-04 DIAGNOSIS — Z7982 Long term (current) use of aspirin: Secondary | ICD-10-CM | POA: Diagnosis not present

## 2020-10-04 DIAGNOSIS — K640 First degree hemorrhoids: Secondary | ICD-10-CM | POA: Insufficient documentation

## 2020-10-04 DIAGNOSIS — K635 Polyp of colon: Secondary | ICD-10-CM | POA: Diagnosis not present

## 2020-10-04 DIAGNOSIS — I10 Essential (primary) hypertension: Secondary | ICD-10-CM | POA: Diagnosis not present

## 2020-10-04 DIAGNOSIS — Z79899 Other long term (current) drug therapy: Secondary | ICD-10-CM | POA: Diagnosis not present

## 2020-10-04 DIAGNOSIS — E039 Hypothyroidism, unspecified: Secondary | ICD-10-CM | POA: Diagnosis not present

## 2020-10-04 DIAGNOSIS — Z86010 Personal history of colonic polyps: Secondary | ICD-10-CM | POA: Diagnosis not present

## 2020-10-04 DIAGNOSIS — Z79890 Hormone replacement therapy (postmenopausal): Secondary | ICD-10-CM | POA: Insufficient documentation

## 2020-10-04 DIAGNOSIS — E785 Hyperlipidemia, unspecified: Secondary | ICD-10-CM | POA: Diagnosis not present

## 2020-10-04 HISTORY — DX: Hyperlipidemia, unspecified: E78.5

## 2020-10-04 HISTORY — PX: COLONOSCOPY WITH PROPOFOL: SHX5780

## 2020-10-04 HISTORY — DX: Essential (primary) hypertension: I10

## 2020-10-04 HISTORY — DX: Hypothyroidism, unspecified: E03.9

## 2020-10-04 SURGERY — COLONOSCOPY WITH PROPOFOL
Anesthesia: General

## 2020-10-04 MED ORDER — PROPOFOL 500 MG/50ML IV EMUL
INTRAVENOUS | Status: AC
  Filled 2020-10-04: qty 50

## 2020-10-04 MED ORDER — SODIUM CHLORIDE 0.9 % IV SOLN: INTRAVENOUS | Status: DC

## 2020-10-04 MED ORDER — PROPOFOL 500 MG/50ML IV EMUL
INTRAVENOUS | Status: DC | PRN
  Administered 2020-10-04: 150 ug/kg/min via INTRAVENOUS

## 2020-10-04 NOTE — Anesthesia Postprocedure Evaluation (Signed)
Anesthesia Post Note  Patient: Don Holder  Procedure(s) Performed: COLONOSCOPY WITH PROPOFOL  Patient location during evaluation: Phase II Anesthesia Type: General Level of consciousness: awake and alert, awake and oriented Pain management: pain level controlled Vital Signs Assessment: post-procedure vital signs reviewed and stable Respiratory status: spontaneous breathing, nonlabored ventilation and respiratory function stable Cardiovascular status: blood pressure returned to baseline and stable Postop Assessment: no apparent nausea or vomiting Anesthetic complications: no   No notable events documented.   Last Vitals:  Vitals:   10/04/20 1140 10/04/20 1150  BP: 104/67 (!) 127/92  Pulse: 69 63  Resp: 17 14  Temp:    SpO2: 100% 96%    Last Pain:  Vitals:   10/04/20 1150  TempSrc:   PainSc: 0-No pain                 Phill Mutter

## 2020-10-04 NOTE — Interval H&P Note (Signed)
History and Physical Interval Note:  10/04/2020 11:00 AM  Don Holder  has presented today for surgery, with the diagnosis of Miles.  The various methods of treatment have been discussed with the patient and family. After consideration of risks, benefits and other options for treatment, the patient has consented to  Procedure(s): COLONOSCOPY WITH PROPOFOL (N/A) as a surgical intervention.  The patient's history has been reviewed, patient examined, no change in status, stable for surgery.  I have reviewed the patient's chart and labs.  Questions were answered to the patient's satisfaction.     Lesly Rubenstein  Ok to proceed with colonoscopy

## 2020-10-04 NOTE — Op Note (Signed)
[facility] Patient Name: Don Holder Procedure Date: 10/04/2020 11:11 AM MRN: [id number] Account #: [id number] Date of Birth: 04/22/1950 Admit Type: Outpatient Age: 70 Room: [facility] ENDO ROOM 3 Gender: Male Note Status: Finalized Procedure:             Colonoscopy Indications:           Surveillance: Personal history of adenomatous polyps                         on last colonoscopy > 5 years ago Providers:             Andrey Farmer MD, MD Referring MD:          Rusty Aus, MD (Referring MD) Medicines:             Monitored Anesthesia Care Complications:         No immediate complications. Estimated blood loss:                         Minimal. Procedure:             Pre-Anesthesia Assessment:                        - Prior to the procedure, a History and Physical was                         performed, and patient medications and allergies were                         reviewed. The patient is competent. The risks and                         benefits of the procedure and the sedation options and                         risks were discussed with the patient. All questions                         were answered and informed consent was obtained.                         Patient identification and proposed procedure were                         verified by the physician, the nurse, the anesthetist                         and the technician in the endoscopy suite. Mental                         Status Examination: alert and oriented. Airway                         Examination: normal oropharyngeal airway and neck                         mobility. Respiratory Examination: clear to  auscultation. CV Examination: normal. Prophylactic                         Antibiotics: The patient does not require prophylactic                         antibiotics. Prior Anticoagulants: The patient has                         taken no previous  anticoagulant or antiplatelet                         agents. ASA Grade Assessment: II - A patient with mild                         systemic disease. After reviewing the risks and                         benefits, the patient was deemed in satisfactory                         condition to undergo the procedure. The anesthesia                         plan was to use monitored anesthesia care (MAC).                         Immediately prior to administration of medications,                         the patient was re-assessed for adequacy to receive                         sedatives. The heart rate, respiratory rate, oxygen                         saturations, blood pressure, adequacy of pulmonary                         ventilation, and response to care were monitored                         throughout the procedure. The physical status of the                         patient was re-assessed after the procedure.                        After obtaining informed consent, the colonoscope was                         passed under direct vision. Throughout the procedure,                         the patient's blood pressure, pulse, and oxygen                         saturations were monitored continuously. The  Colonoscope was introduced through the anus and                         advanced to the the cecum, identified by appendiceal                         orifice and ileocecal valve. The colonoscopy was                         performed without difficulty. The patient tolerated                         the procedure well. The quality of the bowel                         preparation was excellent. Findings:      The perianal and digital rectal examinations were normal.      A 1 mm polyp was found in the hepatic flexure. The polyp was sessile.       The polyp was removed with a jumbo cold forceps. Resection and retrieval       were complete. Estimated blood loss was minimal.       Internal hemorrhoids were found during retroflexion. The hemorrhoids       were Grade I (internal hemorrhoids that do not prolapse).      The exam was otherwise without abnormality on direct and retroflexion       views. Impression:            - One 1 mm polyp at the hepatic flexure, removed with                         a jumbo cold forceps. Resected and retrieved.                        - Internal hemorrhoids.                        - The examination was otherwise normal on direct and                         retroflexion views. Recommendation:        - Discharge patient to home.                        - Resume previous diet.                        - Continue present medications.                        - Await pathology results.                        - Repeat colonoscopy in 10 years for surveillance.                        - Return to referring physician as previously                         scheduled. Procedure Code(s):     --- Professional ---  45380, Colonoscopy, flexible; with biopsy, single or                         multiple Diagnosis Code(s):     --- Professional ---                        Z86.010, Personal history of colonic polyps                        K63.5, Polyp of colon                        K64.0, First degree hemorrhoids CPT copyright 2019 American Medical Association. All rights reserved. The codes documented in this report are preliminary and upon coder review may  be revised to meet current compliance requirements. Andrey Farmer MD, MD 10/04/2020 11:33:57 AM Number of Addenda: 0 Note Initiated On: 10/04/2020 11:11 AM Scope Withdrawal Time: 0 hours 8 minutes 9 seconds  Total Procedure Duration: 0 hours 13 minutes 4 seconds  Estimated Blood Loss:  Estimated blood loss was minimal.      [facility]

## 2020-10-04 NOTE — H&P (Signed)
Outpatient short stay form Pre-procedure 10/04/2020  Lesly Rubenstein, MD  Primary Physician: Rusty Aus, MD  Reason for visit:  Surveillance colonoscopy  History of present illness:   70 y/o gentleman with history of polyp > 10 years ago here for surveillance colonoscopy. No blood thinners. History of appendectomy. No family history of GI malignancies.    Current Facility-Administered Medications:    0.9 %  sodium chloride infusion, , Intravenous, Continuous, Sandy Blouch, Hilton Cork, MD  Medications Prior to Admission  Medication Sig Dispense Refill Last Dose   amLODipine (NORVASC) 5 MG tablet Take 5 mg by mouth daily.   10/03/2020   aspirin 81 MG chewable tablet Chew by mouth daily.   10/03/2020   Cyanocobalamin (VITAMIN B 12) 500 MCG TABS Take 1,000 mcg by mouth.   Past Week   levothyroxine (SYNTHROID) 75 MCG tablet Take 75 mcg by mouth daily before breakfast.   10/04/2020   Multiple Vitamin (MULTIVITAMIN) capsule Take 1 capsule by mouth daily.   Past Week   olmesartan (BENICAR) 20 MG tablet Take 20 mg by mouth daily.   10/03/2020   vitamin C (ASCORBIC ACID) 500 MG tablet Take 500 mg by mouth daily.   Past Week     Allergies  Allergen Reactions   Niacin And Related    Penicillins      Past Medical History:  Diagnosis Date   Hyperlipidemia    Hypertension    Hypothyroidism     Review of systems:  Otherwise negative.    Physical Exam  Gen: Alert, oriented. Appears stated age.  HEENT: PERRLA. Lungs: No respiratory distress CV: RRR Abd: soft, benign, no masses Ext: No edema    Planned procedures: Proceed with colonoscopy. The patient understands the nature of the planned procedure, indications, risks, alternatives and potential complications including but not limited to bleeding, infection, perforation, damage to internal organs and possible oversedation/side effects from anesthesia. The patient agrees and gives consent to proceed.  Please refer to procedure notes  for findings, recommendations and patient disposition/instructions.     Lesly Rubenstein, MD [facility]

## 2020-10-04 NOTE — Transfer of Care (Signed)
Immediate Anesthesia Transfer of Care Note  Patient: Don Holder  Procedure(s) Performed: COLONOSCOPY WITH PROPOFOL  Patient Location: PACU  Anesthesia Type:General  Level of Consciousness: awake and sedated  Airway & Oxygen Therapy: Patient Spontanous Breathing and Patient connected to nasal cannula oxygen  Post-op Assessment: Report given to RN and Post -op Vital signs reviewed and stable  Post vital signs: Reviewed and stable  Last Vitals:  Vitals Value Taken Time  BP    Temp    Pulse    Resp    SpO2      Last Pain:  Vitals:   10/04/20 1050  TempSrc: Temporal  PainSc: 0-No pain         Complications: No notable events documented.

## 2020-10-04 NOTE — Anesthesia Procedure Notes (Signed)
Date/Time: 10/04/2020 11:20 AM Performed by: Vaughan Sine Pre-anesthesia Checklist: Patient identified, Emergency Drugs available, Suction available, Patient being monitored and Timeout performed Patient Re-evaluated:Patient Re-evaluated prior to induction Oxygen Delivery Method: Simple face mask Preoxygenation: Pre-oxygenation with 100% oxygen Induction Type: IV induction Placement Confirmation: positive ETCO2 and CO2 detector

## 2020-10-04 NOTE — Anesthesia Preprocedure Evaluation (Signed)
Anesthesia Evaluation  Patient identified by MRN, date of birth, ID band Patient awake    Reviewed: Allergy & Precautions, NPO status , Patient's Chart, lab work & pertinent test results  Airway Mallampati: III  TM Distance: >3 FB Neck ROM: Full    Dental no notable dental hx.    Pulmonary neg pulmonary ROS,    Pulmonary exam normal        Cardiovascular hypertension, Pt. on medications negative cardio ROS Normal cardiovascular exam     Neuro/Psych negative neurological ROS  negative psych ROS   GI/Hepatic negative GI ROS, Neg liver ROS, Bowel prep,  Endo/Other  Hypothyroidism   Renal/GU negative Renal ROS  negative genitourinary   Musculoskeletal negative musculoskeletal ROS (+)   Abdominal (+) + obese,   Peds negative pediatric ROS (+)  Hematology negative hematology ROS (+)   Anesthesia Other Findings . Hyperlipidemia  . Hyperlipidemia, mixed 05/06/2018  . Hypertension  Hypothyroid   Reproductive/Obstetrics negative OB ROS                            Anesthesia Physical Anesthesia Plan  ASA: 3  Anesthesia Plan: General   Post-op Pain Management:    Induction: Intravenous  PONV Risk Score and Plan: 2 and Propofol infusion and TIVA  Airway Management Planned: Nasal Cannula  Additional Equipment:   Intra-op Plan:   Post-operative Plan:   Informed Consent: I have reviewed the patients History and Physical, chart, labs and discussed the procedure including the risks, benefits and alternatives for the proposed anesthesia with the patient or authorized representative who has indicated his/her understanding and acceptance.       Plan Discussed with: CRNA, Anesthesiologist and Surgeon  Anesthesia Plan Comments:         Anesthesia Quick Evaluation

## 2020-10-05 ENCOUNTER — Encounter: Payer: Self-pay | Admitting: Gastroenterology

## 2020-10-06 LAB — SURGICAL PATHOLOGY

## 2020-10-14 DIAGNOSIS — M79675 Pain in left toe(s): Secondary | ICD-10-CM | POA: Diagnosis not present

## 2020-10-14 DIAGNOSIS — M2041 Other hammer toe(s) (acquired), right foot: Secondary | ICD-10-CM | POA: Diagnosis not present

## 2020-10-14 DIAGNOSIS — M898X9 Other specified disorders of bone, unspecified site: Secondary | ICD-10-CM | POA: Diagnosis not present

## 2020-10-14 DIAGNOSIS — M79674 Pain in right toe(s): Secondary | ICD-10-CM | POA: Diagnosis not present

## 2020-10-14 DIAGNOSIS — M2042 Other hammer toe(s) (acquired), left foot: Secondary | ICD-10-CM | POA: Diagnosis not present

## 2020-11-16 DIAGNOSIS — J40 Bronchitis, not specified as acute or chronic: Secondary | ICD-10-CM | POA: Diagnosis not present

## 2020-11-16 DIAGNOSIS — Z20822 Contact with and (suspected) exposure to covid-19: Secondary | ICD-10-CM | POA: Diagnosis not present

## 2020-11-16 DIAGNOSIS — H6692 Otitis media, unspecified, left ear: Secondary | ICD-10-CM | POA: Diagnosis not present

## 2020-11-22 DIAGNOSIS — Z23 Encounter for immunization: Secondary | ICD-10-CM | POA: Diagnosis not present

## 2020-11-22 DIAGNOSIS — H6592 Unspecified nonsuppurative otitis media, left ear: Secondary | ICD-10-CM | POA: Diagnosis not present

## 2020-11-22 DIAGNOSIS — I10 Essential (primary) hypertension: Secondary | ICD-10-CM | POA: Diagnosis not present

## 2020-12-06 DIAGNOSIS — H52201 Unspecified astigmatism, right eye: Secondary | ICD-10-CM | POA: Diagnosis not present

## 2020-12-06 DIAGNOSIS — H2511 Age-related nuclear cataract, right eye: Secondary | ICD-10-CM | POA: Diagnosis not present

## 2020-12-06 DIAGNOSIS — H25011 Cortical age-related cataract, right eye: Secondary | ICD-10-CM | POA: Diagnosis not present

## 2020-12-07 DIAGNOSIS — H2512 Age-related nuclear cataract, left eye: Secondary | ICD-10-CM | POA: Diagnosis not present

## 2020-12-16 DIAGNOSIS — H903 Sensorineural hearing loss, bilateral: Secondary | ICD-10-CM | POA: Diagnosis not present

## 2020-12-16 DIAGNOSIS — H6122 Impacted cerumen, left ear: Secondary | ICD-10-CM | POA: Diagnosis not present

## 2020-12-16 DIAGNOSIS — H9122 Sudden idiopathic hearing loss, left ear: Secondary | ICD-10-CM | POA: Diagnosis not present

## 2020-12-20 DIAGNOSIS — H25012 Cortical age-related cataract, left eye: Secondary | ICD-10-CM | POA: Diagnosis not present

## 2020-12-20 DIAGNOSIS — H2512 Age-related nuclear cataract, left eye: Secondary | ICD-10-CM | POA: Diagnosis not present

## 2020-12-20 DIAGNOSIS — H52202 Unspecified astigmatism, left eye: Secondary | ICD-10-CM | POA: Diagnosis not present

## 2021-01-10 DIAGNOSIS — H9122 Sudden idiopathic hearing loss, left ear: Secondary | ICD-10-CM | POA: Diagnosis not present

## 2021-01-10 DIAGNOSIS — H903 Sensorineural hearing loss, bilateral: Secondary | ICD-10-CM | POA: Diagnosis not present

## 2021-02-02 DIAGNOSIS — D045 Carcinoma in situ of skin of trunk: Secondary | ICD-10-CM | POA: Diagnosis not present

## 2021-02-02 DIAGNOSIS — D2262 Melanocytic nevi of left upper limb, including shoulder: Secondary | ICD-10-CM | POA: Diagnosis not present

## 2021-02-02 DIAGNOSIS — C44519 Basal cell carcinoma of skin of other part of trunk: Secondary | ICD-10-CM | POA: Diagnosis not present

## 2021-02-02 DIAGNOSIS — L821 Other seborrheic keratosis: Secondary | ICD-10-CM | POA: Diagnosis not present

## 2021-02-02 DIAGNOSIS — L570 Actinic keratosis: Secondary | ICD-10-CM | POA: Diagnosis not present

## 2021-02-02 DIAGNOSIS — Z85828 Personal history of other malignant neoplasm of skin: Secondary | ICD-10-CM | POA: Diagnosis not present

## 2021-02-02 DIAGNOSIS — D485 Neoplasm of uncertain behavior of skin: Secondary | ICD-10-CM | POA: Diagnosis not present

## 2021-02-02 DIAGNOSIS — C44619 Basal cell carcinoma of skin of left upper limb, including shoulder: Secondary | ICD-10-CM | POA: Diagnosis not present

## 2021-02-02 DIAGNOSIS — D2261 Melanocytic nevi of right upper limb, including shoulder: Secondary | ICD-10-CM | POA: Diagnosis not present

## 2021-02-02 DIAGNOSIS — C44529 Squamous cell carcinoma of skin of other part of trunk: Secondary | ICD-10-CM | POA: Diagnosis not present

## 2021-02-02 DIAGNOSIS — X32XXXA Exposure to sunlight, initial encounter: Secondary | ICD-10-CM | POA: Diagnosis not present

## 2021-02-02 DIAGNOSIS — D2272 Melanocytic nevi of left lower limb, including hip: Secondary | ICD-10-CM | POA: Diagnosis not present

## 2021-03-03 ENCOUNTER — Other Ambulatory Visit: Payer: Self-pay | Admitting: Podiatry

## 2021-03-03 DIAGNOSIS — M2041 Other hammer toe(s) (acquired), right foot: Secondary | ICD-10-CM | POA: Diagnosis not present

## 2021-03-03 DIAGNOSIS — M2042 Other hammer toe(s) (acquired), left foot: Secondary | ICD-10-CM | POA: Diagnosis not present

## 2021-03-03 DIAGNOSIS — M79675 Pain in left toe(s): Secondary | ICD-10-CM | POA: Diagnosis not present

## 2021-03-03 DIAGNOSIS — M79674 Pain in right toe(s): Secondary | ICD-10-CM | POA: Diagnosis not present

## 2021-03-08 ENCOUNTER — Encounter: Payer: Self-pay | Admitting: Podiatry

## 2021-03-22 NOTE — Discharge Instructions (Addendum)
[facility]  POST OPERATIVE INSTRUCTIONS FOR DR. FOWLER AND [facility]   Take your medication as prescribed.  Pain medication should be taken only as needed.  Keep the dressing clean, dry and intact.  Keep your foot elevated above the heart level for the first 48 hours.  Walking to the bathroom and brief periods of walking are acceptable, unless we have instructed you to be non-weight bearing.  Always wear your post-op shoe when walking.  Always use your crutches if you are to be non-weight bearing.  Do not take a shower. Baths are permissible as long as the foot is kept out of the water.   Every hour you are awake:  Bend your knee 15 times. Flex foot 15 times Massage calf 15 times  Call [facility] ([phone]) if any of the following problems occur: You develop a temperature or fever. The bandage becomes saturated with blood. Medication does not stop your pain. Injury of the foot occurs. Any symptoms of infection including redness, odor, or red streaks running from wound.  Information for Discharge Teaching: EXPAREL (bupivacaine liposome injectable suspension)   Your surgeon or anesthesiologist gave you EXPAREL(bupivacaine) to help control your pain after surgery.  EXPAREL is a local anesthetic that provides pain relief by numbing the tissue around the surgical site. EXPAREL is designed to release pain medication over time and can control pain for up to 72 hours. Depending on how you respond to EXPAREL, you may require less pain medication during your recovery.  Possible side effects: Temporary loss of sensation or ability to move in the area where bupivacaine was injected. Nausea, vomiting, constipation Rarely, numbness and tingling in your mouth or lips, lightheadedness, or anxiety may occur. Call your doctor right away if you think you may be experiencing any of these sensations, or if  you have other questions regarding possible side effects.  Follow all other discharge instructions given to you by your surgeon or nurse. Eat a healthy diet and drink plenty of water or other fluids.  If you return to the hospital for any reason within 96 hours following the administration of EXPAREL, it is important for health care providers to know that you have received this anesthetic. A teal colored band has been placed on your arm with the date, time and amount of EXPAREL you have received in order to alert and inform your health care providers. Please leave this armband in place for the full 96 hours following administration, and then you may remove the band.   

## 2021-03-23 ENCOUNTER — Ambulatory Visit: Payer: Self-pay

## 2021-03-23 ENCOUNTER — Other Ambulatory Visit: Payer: Self-pay

## 2021-03-23 ENCOUNTER — Encounter
Admission: RE | Disposition: A | Discharge status: Home / Self Care | Payer: Self-pay | Source: Home / Self Care | Attending: Podiatry

## 2021-03-23 ENCOUNTER — Encounter: Payer: Self-pay | Admitting: Podiatry

## 2021-03-23 ENCOUNTER — Ambulatory Visit: Payer: PPO | Admitting: Anesthesiology

## 2021-03-23 ENCOUNTER — Ambulatory Visit
Admission: RE | Admit: 2021-03-23 | Discharge: 2021-03-23 | Disposition: A | Discharge status: Home / Self Care | Payer: PPO | Attending: Podiatry | Admitting: Podiatry

## 2021-03-23 DIAGNOSIS — E039 Hypothyroidism, unspecified: Secondary | ICD-10-CM | POA: Diagnosis not present

## 2021-03-23 DIAGNOSIS — M2041 Other hammer toe(s) (acquired), right foot: Secondary | ICD-10-CM | POA: Diagnosis not present

## 2021-03-23 DIAGNOSIS — M898X7 Other specified disorders of bone, ankle and foot: Secondary | ICD-10-CM | POA: Diagnosis not present

## 2021-03-23 DIAGNOSIS — M79675 Pain in left toe(s): Secondary | ICD-10-CM | POA: Diagnosis not present

## 2021-03-23 DIAGNOSIS — M899 Disorder of bone, unspecified: Secondary | ICD-10-CM | POA: Diagnosis not present

## 2021-03-23 DIAGNOSIS — M79674 Pain in right toe(s): Secondary | ICD-10-CM | POA: Diagnosis not present

## 2021-03-23 DIAGNOSIS — M2042 Other hammer toe(s) (acquired), left foot: Secondary | ICD-10-CM | POA: Insufficient documentation

## 2021-03-23 DIAGNOSIS — Z79899 Other long term (current) drug therapy: Secondary | ICD-10-CM | POA: Diagnosis not present

## 2021-03-23 DIAGNOSIS — I10 Essential (primary) hypertension: Secondary | ICD-10-CM | POA: Diagnosis not present

## 2021-03-23 HISTORY — PX: EXCISION PARTIAL PHALANX: SHX6617

## 2021-03-23 HISTORY — PX: HAMMER TOE SURGERY: SHX385

## 2021-03-23 HISTORY — DX: Unspecified osteoarthritis, unspecified site: M19.90

## 2021-03-23 HISTORY — DX: Presence of external hearing-aid: Z97.4

## 2021-03-23 SURGERY — EXCISION, PHALANX, PARTIAL
Anesthesia: General | Site: Toe | Laterality: Bilateral

## 2021-03-23 MED ORDER — OXYCODONE HCL 5 MG/5ML PO SOLN: 5.0000 mg | Freq: Once | ORAL | Status: DC | PRN

## 2021-03-23 MED ORDER — MIDAZOLAM HCL 5 MG/5ML IJ SOLN
INTRAMUSCULAR | Status: DC | PRN
  Administered 2021-03-23: 2 mg via INTRAVENOUS

## 2021-03-23 MED ORDER — BUPIVACAINE HCL (PF) 0.25 % IJ SOLN
INTRAMUSCULAR | Status: DC | PRN
  Administered 2021-03-23: 10 mL via INTRA_ARTICULAR

## 2021-03-23 MED ORDER — LIDOCAINE HCL (CARDIAC) PF 100 MG/5ML IV SOSY
PREFILLED_SYRINGE | INTRAVENOUS | Status: DC | PRN
  Administered 2021-03-23: 40 mg via INTRATRACHEAL

## 2021-03-23 MED ORDER — ONDANSETRON HCL 4 MG/2ML IJ SOLN
INTRAMUSCULAR | Status: DC | PRN
  Administered 2021-03-23: 4 mg via INTRAVENOUS

## 2021-03-23 MED ORDER — PROPOFOL 10 MG/ML IV BOLUS
INTRAVENOUS | Status: DC | PRN
  Administered 2021-03-23: 40 mg via INTRAVENOUS
  Administered 2021-03-23: 140 mg via INTRAVENOUS

## 2021-03-23 MED ORDER — KETOROLAC TROMETHAMINE 30 MG/ML IJ SOLN: 15.0000 mg | Freq: Once | INTRAMUSCULAR | Status: DC | PRN

## 2021-03-23 MED ORDER — FENTANYL CITRATE PF 50 MCG/ML IJ SOSY: 25.0000 ug | PREFILLED_SYRINGE | INTRAMUSCULAR | Status: DC | PRN

## 2021-03-23 MED ORDER — OXYCODONE-ACETAMINOPHEN 5-325 MG PO TABS: 1.0000 | ORAL_TABLET | Freq: Four times a day (QID) | ORAL | 0 refills | Status: DC | PRN

## 2021-03-23 MED ORDER — BUPIVACAINE LIPOSOME 1.3 % IJ SUSP
INTRAMUSCULAR | Status: DC | PRN
  Administered 2021-03-23: 10 mL

## 2021-03-23 MED ORDER — DEXAMETHASONE SODIUM PHOSPHATE 4 MG/ML IJ SOLN
INTRAMUSCULAR | Status: DC | PRN
Start: 2021-03-23 — End: 2021-03-23
  Administered 2021-03-23: 4 mg via INTRAVENOUS

## 2021-03-23 MED ORDER — GLYCOPYRROLATE 0.2 MG/ML IJ SOLN
INTRAMUSCULAR | Status: DC | PRN
Start: 2021-03-23 — End: 2021-03-23
  Administered 2021-03-23: .1 mg via INTRAVENOUS

## 2021-03-23 MED ORDER — LACTATED RINGERS IV SOLN: INTRAVENOUS | Status: DC

## 2021-03-23 MED ORDER — ONDANSETRON HCL 4 MG/2ML IJ SOLN: 4.0000 mg | Freq: Once | INTRAMUSCULAR | Status: DC | PRN

## 2021-03-23 MED ORDER — OXYCODONE HCL 5 MG PO TABS: 5.0000 mg | ORAL_TABLET | Freq: Once | ORAL | Status: DC | PRN

## 2021-03-23 MED ORDER — CLINDAMYCIN PHOSPHATE 900 MG/50ML IV SOLN
900.0000 mg | INTRAVENOUS | Status: AC
  Administered 2021-03-23: 900 mg via INTRAVENOUS

## 2021-03-23 MED ORDER — EPHEDRINE SULFATE (PRESSORS) 50 MG/ML IJ SOLN
INTRAMUSCULAR | Status: DC | PRN
Start: 2021-03-23 — End: 2021-03-23
  Administered 2021-03-23: 5 mg via INTRAVENOUS
  Administered 2021-03-23 (×2): 10 mg via INTRAVENOUS
  Administered 2021-03-23: 5 mg via INTRAVENOUS
  Administered 2021-03-23 (×2): 10 mg via INTRAVENOUS

## 2021-03-23 MED ORDER — SCOPOLAMINE 1 MG/3DAYS TD PT72
MEDICATED_PATCH | TRANSDERMAL | Status: AC
  Filled 2021-03-23: qty 1

## 2021-03-23 MED ORDER — FENTANYL CITRATE (PF) 100 MCG/2ML IJ SOLN
INTRAMUSCULAR | Status: DC | PRN
  Administered 2021-03-23: 25 ug via INTRAVENOUS

## 2021-03-23 SURGICAL SUPPLY — 30 items
APL SKNCLS STERI-STRIP NONHPOA (GAUZE/BANDAGES/DRESSINGS) ×1
BENZOIN TINCTURE PRP APPL 2/3 (GAUZE/BANDAGES/DRESSINGS) ×2 IMPLANT
BLADE OSC/SAGITTAL MD 5.5X18 (BLADE) ×1 IMPLANT
BLADE SURG 15 STRL LF DISP TIS (BLADE) IMPLANT
BLADE SURG 15 STRL SS (BLADE) ×2
BNDG CMPR 75X41 PLY HI ABS (GAUZE/BANDAGES/DRESSINGS) ×1
BNDG ESMARK 4X12 TAN STRL LF (GAUZE/BANDAGES/DRESSINGS) ×2 IMPLANT
BNDG GAUZE ELAST 4 BULKY (GAUZE/BANDAGES/DRESSINGS) ×2 IMPLANT
BNDG STRETCH 4X75 STRL LF (GAUZE/BANDAGES/DRESSINGS) ×2 IMPLANT
CANISTER SUCT 1200ML W/VALVE (MISCELLANEOUS) ×2 IMPLANT
COVER LIGHT HANDLE UNIVERSAL (MISCELLANEOUS) ×4 IMPLANT
CUFF TOURN SGL QUICK 18X4 (TOURNIQUET CUFF) IMPLANT
DRAPE FLUOR MINI C-ARM 54X84 (DRAPES) ×2 IMPLANT
DURAPREP 26ML APPLICATOR (WOUND CARE) ×2 IMPLANT
ELECT REM PT RETURN 9FT ADLT (ELECTROSURGICAL) ×2
ELECTRODE REM PT RTRN 9FT ADLT (ELECTROSURGICAL) ×1 IMPLANT
GAUZE SPONGE 4X4 12PLY STRL (GAUZE/BANDAGES/DRESSINGS) ×2 IMPLANT
GAUZE XEROFORM 1X8 LF (GAUZE/BANDAGES/DRESSINGS) ×2 IMPLANT
GLOVE SRG 8 PF TXTR STRL LF DI (GLOVE) ×1 IMPLANT
GLOVE SURG ENC MOIS LTX SZ7.5 (GLOVE) ×2 IMPLANT
GLOVE SURG UNDER POLY LF SZ8 (GLOVE) ×2
GOWN STRL REUS W/ TWL LRG LVL3 (GOWN DISPOSABLE) ×2 IMPLANT
GOWN STRL REUS W/TWL LRG LVL3 (GOWN DISPOSABLE) ×4
KIT TURNOVER KIT A (KITS) ×2 IMPLANT
NS IRRIG 500ML POUR BTL (IV SOLUTION) ×2 IMPLANT
PACK EXTREMITY ARMC (MISCELLANEOUS) ×2 IMPLANT
PENCIL SMOKE EVACUATOR (MISCELLANEOUS) ×2 IMPLANT
STOCKINETTE IMPERVIOUS LG (DRAPES) ×3 IMPLANT
SUT ETHILON 3-0 (SUTURE) ×2 IMPLANT
SUT VIC AB 4-0 FS2 27 (SUTURE) ×2 IMPLANT

## 2021-03-23 NOTE — Anesthesia Postprocedure Evaluation (Signed)
Anesthesia Post Note  Patient: Don Holder  Procedure(s) Performed: EXCISION PARTIAL PHALANX - FOURTH TOES (Bilateral: Toe) HAMMER TOE CORRECTION - FIFTH TOES (Bilateral: Toe)     Patient location during evaluation: PACU Anesthesia Type: General Level of consciousness: awake and alert Pain management: pain level controlled Vital Signs Assessment: post-procedure vital signs reviewed and stable Respiratory status: spontaneous breathing, nonlabored ventilation, respiratory function stable and patient connected to nasal cannula oxygen Cardiovascular status: blood pressure returned to baseline and stable Postop Assessment: no apparent nausea or vomiting Anesthetic complications: no   No notable events documented.  Sinda Du

## 2021-03-23 NOTE — Transfer of Care (Signed)
Immediate Anesthesia Transfer of Care Note  Patient: Don Holder  Procedure(s) Performed: EXCISION PARTIAL PHALANX - FOURTH TOES (Bilateral: Toe) HAMMER TOE CORRECTION - FIFTH TOES (Bilateral: Toe)  Patient Location: PACU  Anesthesia Type: General  Level of Consciousness: awake, alert  and patient cooperative  Airway and Oxygen Therapy: Patient Spontanous Breathing and Patient connected to supplemental oxygen  Post-op Assessment: Post-op Vital signs reviewed, Patient's Cardiovascular Status Stable, Respiratory Function Stable, Patent Airway and No signs of Nausea or vomiting  Post-op Vital Signs: Reviewed and stable  Complications: No notable events documented.

## 2021-03-23 NOTE — H&P (Signed)
HISTORY AND PHYSICAL INTERVAL NOTE:  03/23/2021  11:33 AM  Don Holder  has presented today for surgery, with the diagnosis of M79.674, M79.675 - Pain in toes of both feet M20.41, M20.42 - Hammertoes of both feet.  The various methods of treatment have been discussed with the patient.  No guarantees were given.  After consideration of risks, benefits and other options for treatment, the patient has consented to surgery.  I have reviewed the patients chart and labs.     A history and physical examination was performed in my office.  The patient was reexamined.  There have been no changes to this history and physical examination.  [name]

## 2021-03-23 NOTE — Anesthesia Procedure Notes (Signed)
Procedure Name: LMA Insertion Date/Time: 03/23/2021 12:01 PM Performed by: Mayme Genta, CRNA Pre-anesthesia Checklist: Patient identified, Emergency Drugs available, Suction available, Timeout performed and Patient being monitored Patient Re-evaluated:Patient Re-evaluated prior to induction Oxygen Delivery Method: Circle system utilized Preoxygenation: Pre-oxygenation with 100% oxygen Induction Type: IV induction LMA: LMA inserted LMA Size: 4.0 Number of attempts: 1 Placement Confirmation: positive ETCO2 and breath sounds checked- equal and bilateral Tube secured with: Tape

## 2021-03-23 NOTE — Op Note (Signed)
Operative note   Surgeon:Naphtali Zywicki Lawyer: None    Preop diagnosis: 1.  Right fifth toe hammertoe 2.  Right fourth toe exostosis proximal phalanx 3.  Left fifth toe hammertoe 4.  Left fourth toe exostosis proximal phalanx    Postop diagnosis: Same    Procedure: 1.  Arthroplasty right fifth toe 2.  Exostectomy base of lateral right fourth toe 3.  Arthroplasty left fifth toe 4.  Exostectomy base of lateral left fourth toe    EBL: Minimal    Anesthesia:local and general.  Local consisted of a one-to-one mixture of 0.25% bupivacaine and Exparel long-acting anesthetic.  A total of 10 cc of each were used infiltrated diffusely around the fourth and fifth toes bilaterally    Hemostasis: Ankle tourniquet inflated to 200 mmHg for approximately 25 minutes per ankle    Specimen: None    Complications: None    Operative indications:Don Holder is an 71 y.o. that presents today for surgical intervention.  The risks/benefits/alternatives/complications have been discussed and consent has been given.    Procedure:  Patient was brought into the OR and placed on the operating table in thesupine position. After anesthesia was obtained the bilateral lower extremity was prepped and draped in usual sterile fashion.  Attention was initially addressed to the right foot.  Inflation of the tourniquet was performed.  A curvilinear incision was made with 2 semielliptical flaps created over the fifth toe.  The skin was removed from the area.  Incision was taken down to the long extensor tendon.  A tenotomy was performed.  The head of the proximal phalanx was then exposed.  Skin incision was performed overlying the base of the fourth metatarsal laterally.  Sharp and blunt dissection carried down to the capsule.  Subperiosteal dissection was then undertaken on the base of the lateral aspect of the fourth toe proximal phalanx.  Next with a power saw the lateral base of the fourth proximal phalanx was  excised.  Next the head of the proximal phalanx of the fifth toe was then excised.  All areas were flushed with copious amounts of irrigation.  Good decompression was noted at the site of the painful hyperkeratotic lesion.  After final flush closure was performed with a 4-0 Vicryl for the subcutaneous tissue and extensor tendon.  The skin was closed with a 3-0 nylon.  The right foot was then bandaged appropriately.  It was covered and the tourniquet was dropped.  Attention was then directed to the left foot where inflation of the tourniquet was performed.  Attention was directed to the fifth toe where 2 semielliptical flaps were created dorsal distal to proximal plantar.  The skin was excised.  The long sensor tendon was cut and the head of the proximal phalanx was exposed.  Attention was then directed to the base of the fourth toe laterally.  Sharp and blunt was carried down to the periosteum.  Subperiosteal dissection was then performed to the base of the proximal phalanx.  Next with a power saw the lateral base of the fourth toe proximal phalanx was excised.  Next the head of the proximal phalanx of the fifth toe was then removed.  Good decompression of all areas were performed.  All wounds were flushed with copious amounts of irrigation.  Closure was performed with a 4-0 Vicryl for the deeper tissues as well as the extensor tendon.  A 3-0 nylon was used to close the skin.  Bulky sterile dressing was applied.  Patient tolerated the procedure and anesthesia well.  Was transported from the OR to the PACU with all vital signs stable and vascular status intact. To be discharged per routine protocol.  Will follow up in approximately 1 week in the outpatient clinic.

## 2021-03-23 NOTE — Anesthesia Preprocedure Evaluation (Signed)
Anesthesia Evaluation  Patient identified by MRN, date of birth, ID band Patient awake    Reviewed: Allergy & Precautions, NPO status , Patient's Chart, lab work & pertinent test results  Airway Mallampati: III  TM Distance: >3 FB Neck ROM: Full    Dental no notable dental hx.    Pulmonary neg pulmonary ROS,    Pulmonary exam normal breath sounds clear to auscultation       Cardiovascular Exercise Tolerance: Good hypertension, Pt. on medications Normal cardiovascular exam Rhythm:Regular Rate:Normal     Neuro/Psych negative neurological ROS  negative psych ROS   GI/Hepatic negative GI ROS, Neg liver ROS,   Endo/Other  Hypothyroidism   Renal/GU negative Renal ROS  negative genitourinary   Musculoskeletal  (+) Arthritis ,   Abdominal (+) + obese,   Peds negative pediatric ROS (+)  Hematology negative hematology ROS (+)   Anesthesia Other Findings  Hyperlipidemia   Hyperlipidemia, mixed 05/06/2018   Hypertension  Hypothyroid   Reproductive/Obstetrics negative OB ROS                             Anesthesia Physical  Anesthesia Plan  ASA: 3  Anesthesia Plan: General   Post-op Pain Management: Ofirmev IV (intra-op)   Induction: Intravenous  PONV Risk Score and Plan: 2 and Propofol infusion, TIVA and Treatment may vary due to age or medical condition  Airway Management Planned: LMA  Additional Equipment:   Intra-op Plan:   Post-operative Plan:   Informed Consent: I have reviewed the patients History and Physical, chart, labs and discussed the procedure including the risks, benefits and alternatives for the proposed anesthesia with the patient or authorized representative who has indicated his/her understanding and acceptance.       Plan Discussed with: CRNA, Anesthesiologist and Surgeon  Anesthesia Plan Comments:         Anesthesia Quick Evaluation  There are  no problems to display for this patient.   CBC Latest Ref Rng & Units 12/27/2013  WBC 3.8 - 10.6 x10 3/mm 3 7.5  Hemoglobin 13.0 - 18.0 g/dL 14.1  Hematocrit 40.0 - 52.0 % 42.5  Platelets 150 - 440 x10 3/mm 3 162   BMP Latest Ref Rng & Units 12/27/2013  Glucose 65 - 99 mg/dL 102(H)  BUN 7 - 18 mg/dL 19(H)  Creatinine 0.60 - 1.30 mg/dL 1.38(H)  Sodium 136 - 145 mmol/L 140  Potassium 3.5 - 5.1 mmol/L 3.7  Chloride 98 - 107 mmol/L 108(H)  CO2 21 - 32 mmol/L 25  Calcium 8.5 - 10.1 mg/dL 8.3(L)    Risks and benefits of anesthesia discussed at length, patient or surrogate demonstrates understanding. Appropriately NPO. Plan to proceed with anesthesia.  Champ Mungo, MD 03/23/21

## 2021-03-24 ENCOUNTER — Encounter: Payer: Self-pay | Admitting: Podiatry

## 2021-03-29 DIAGNOSIS — M2041 Other hammer toe(s) (acquired), right foot: Secondary | ICD-10-CM | POA: Diagnosis not present

## 2021-03-29 DIAGNOSIS — M79674 Pain in right toe(s): Secondary | ICD-10-CM | POA: Diagnosis not present

## 2021-03-29 DIAGNOSIS — M2042 Other hammer toe(s) (acquired), left foot: Secondary | ICD-10-CM | POA: Diagnosis not present

## 2021-03-29 DIAGNOSIS — M79675 Pain in left toe(s): Secondary | ICD-10-CM | POA: Diagnosis not present

## 2021-04-18 DIAGNOSIS — C44529 Squamous cell carcinoma of skin of other part of trunk: Secondary | ICD-10-CM | POA: Diagnosis not present

## 2021-04-18 DIAGNOSIS — C44619 Basal cell carcinoma of skin of left upper limb, including shoulder: Secondary | ICD-10-CM | POA: Diagnosis not present

## 2021-04-18 DIAGNOSIS — L905 Scar conditions and fibrosis of skin: Secondary | ICD-10-CM | POA: Diagnosis not present

## 2021-04-21 DIAGNOSIS — R600 Localized edema: Secondary | ICD-10-CM | POA: Diagnosis not present

## 2021-04-21 DIAGNOSIS — R42 Dizziness and giddiness: Secondary | ICD-10-CM | POA: Diagnosis not present

## 2021-05-02 DIAGNOSIS — C44519 Basal cell carcinoma of skin of other part of trunk: Secondary | ICD-10-CM | POA: Diagnosis not present

## 2021-05-02 DIAGNOSIS — D045 Carcinoma in situ of skin of trunk: Secondary | ICD-10-CM | POA: Diagnosis not present

## 2021-05-03 DIAGNOSIS — M79674 Pain in right toe(s): Secondary | ICD-10-CM | POA: Diagnosis not present

## 2021-05-03 DIAGNOSIS — M2042 Other hammer toe(s) (acquired), left foot: Secondary | ICD-10-CM | POA: Diagnosis not present

## 2021-05-03 DIAGNOSIS — M79675 Pain in left toe(s): Secondary | ICD-10-CM | POA: Diagnosis not present

## 2021-05-03 DIAGNOSIS — M2041 Other hammer toe(s) (acquired), right foot: Secondary | ICD-10-CM | POA: Diagnosis not present

## 2021-05-13 DIAGNOSIS — E538 Deficiency of other specified B group vitamins: Secondary | ICD-10-CM | POA: Diagnosis not present

## 2021-05-13 DIAGNOSIS — R739 Hyperglycemia, unspecified: Secondary | ICD-10-CM | POA: Diagnosis not present

## 2021-05-13 DIAGNOSIS — Z125 Encounter for screening for malignant neoplasm of prostate: Secondary | ICD-10-CM | POA: Diagnosis not present

## 2021-05-13 DIAGNOSIS — E782 Mixed hyperlipidemia: Secondary | ICD-10-CM | POA: Diagnosis not present

## 2021-05-20 DIAGNOSIS — Z0000 Encounter for general adult medical examination without abnormal findings: Secondary | ICD-10-CM | POA: Diagnosis not present

## 2021-05-20 DIAGNOSIS — E782 Mixed hyperlipidemia: Secondary | ICD-10-CM | POA: Diagnosis not present

## 2021-05-20 DIAGNOSIS — Z125 Encounter for screening for malignant neoplasm of prostate: Secondary | ICD-10-CM | POA: Diagnosis not present

## 2021-05-20 DIAGNOSIS — E538 Deficiency of other specified B group vitamins: Secondary | ICD-10-CM | POA: Diagnosis not present

## 2021-07-26 DIAGNOSIS — H524 Presbyopia: Secondary | ICD-10-CM | POA: Diagnosis not present

## 2021-07-26 DIAGNOSIS — H5203 Hypermetropia, bilateral: Secondary | ICD-10-CM | POA: Diagnosis not present

## 2021-07-26 DIAGNOSIS — H52223 Regular astigmatism, bilateral: Secondary | ICD-10-CM | POA: Diagnosis not present

## 2021-07-26 DIAGNOSIS — Z961 Presence of intraocular lens: Secondary | ICD-10-CM | POA: Diagnosis not present

## 2021-07-26 DIAGNOSIS — H02831 Dermatochalasis of right upper eyelid: Secondary | ICD-10-CM | POA: Diagnosis not present

## 2021-07-26 DIAGNOSIS — H02834 Dermatochalasis of left upper eyelid: Secondary | ICD-10-CM | POA: Diagnosis not present

## 2021-07-26 DIAGNOSIS — H43813 Vitreous degeneration, bilateral: Secondary | ICD-10-CM | POA: Diagnosis not present

## 2021-08-18 DIAGNOSIS — D2261 Melanocytic nevi of right upper limb, including shoulder: Secondary | ICD-10-CM | POA: Diagnosis not present

## 2021-08-18 DIAGNOSIS — D2262 Melanocytic nevi of left upper limb, including shoulder: Secondary | ICD-10-CM | POA: Diagnosis not present

## 2021-08-18 DIAGNOSIS — Z85828 Personal history of other malignant neoplasm of skin: Secondary | ICD-10-CM | POA: Diagnosis not present

## 2021-08-18 DIAGNOSIS — L570 Actinic keratosis: Secondary | ICD-10-CM | POA: Diagnosis not present

## 2021-08-18 DIAGNOSIS — C44519 Basal cell carcinoma of skin of other part of trunk: Secondary | ICD-10-CM | POA: Diagnosis not present

## 2021-08-18 DIAGNOSIS — D485 Neoplasm of uncertain behavior of skin: Secondary | ICD-10-CM | POA: Diagnosis not present

## 2021-08-18 DIAGNOSIS — X32XXXA Exposure to sunlight, initial encounter: Secondary | ICD-10-CM | POA: Diagnosis not present

## 2021-08-18 DIAGNOSIS — D2271 Melanocytic nevi of right lower limb, including hip: Secondary | ICD-10-CM | POA: Diagnosis not present

## 2021-08-18 DIAGNOSIS — S70361A Insect bite (nonvenomous), right thigh, initial encounter: Secondary | ICD-10-CM | POA: Diagnosis not present

## 2021-10-25 DIAGNOSIS — D235 Other benign neoplasm of skin of trunk: Secondary | ICD-10-CM | POA: Diagnosis not present

## 2021-10-25 DIAGNOSIS — C44519 Basal cell carcinoma of skin of other part of trunk: Secondary | ICD-10-CM | POA: Diagnosis not present

## 2022-01-25 DIAGNOSIS — Z23 Encounter for immunization: Secondary | ICD-10-CM | POA: Diagnosis not present

## 2022-01-27 DIAGNOSIS — M5116 Intervertebral disc disorders with radiculopathy, lumbar region: Secondary | ICD-10-CM | POA: Diagnosis not present

## 2022-01-27 DIAGNOSIS — I10 Essential (primary) hypertension: Secondary | ICD-10-CM | POA: Diagnosis not present

## 2022-02-27 DIAGNOSIS — L570 Actinic keratosis: Secondary | ICD-10-CM | POA: Diagnosis not present

## 2022-02-27 DIAGNOSIS — D2262 Melanocytic nevi of left upper limb, including shoulder: Secondary | ICD-10-CM | POA: Diagnosis not present

## 2022-02-27 DIAGNOSIS — Z85828 Personal history of other malignant neoplasm of skin: Secondary | ICD-10-CM | POA: Diagnosis not present

## 2022-02-27 DIAGNOSIS — D2261 Melanocytic nevi of right upper limb, including shoulder: Secondary | ICD-10-CM | POA: Diagnosis not present

## 2022-02-27 DIAGNOSIS — D2272 Melanocytic nevi of left lower limb, including hip: Secondary | ICD-10-CM | POA: Diagnosis not present

## 2022-02-27 DIAGNOSIS — X32XXXA Exposure to sunlight, initial encounter: Secondary | ICD-10-CM | POA: Diagnosis not present

## 2022-03-08 DIAGNOSIS — H43813 Vitreous degeneration, bilateral: Secondary | ICD-10-CM | POA: Diagnosis not present

## 2022-03-08 DIAGNOSIS — H5203 Hypermetropia, bilateral: Secondary | ICD-10-CM | POA: Diagnosis not present

## 2022-03-08 DIAGNOSIS — H02831 Dermatochalasis of right upper eyelid: Secondary | ICD-10-CM | POA: Diagnosis not present

## 2022-03-08 DIAGNOSIS — H52223 Regular astigmatism, bilateral: Secondary | ICD-10-CM | POA: Diagnosis not present

## 2022-03-08 DIAGNOSIS — Z961 Presence of intraocular lens: Secondary | ICD-10-CM | POA: Diagnosis not present

## 2022-03-08 DIAGNOSIS — H524 Presbyopia: Secondary | ICD-10-CM | POA: Diagnosis not present

## 2022-03-08 DIAGNOSIS — H04123 Dry eye syndrome of bilateral lacrimal glands: Secondary | ICD-10-CM | POA: Diagnosis not present

## 2022-03-08 DIAGNOSIS — H02834 Dermatochalasis of left upper eyelid: Secondary | ICD-10-CM | POA: Diagnosis not present

## 2022-03-21 DIAGNOSIS — H0288B Meibomian gland dysfunction left eye, upper and lower eyelids: Secondary | ICD-10-CM | POA: Diagnosis not present

## 2022-03-21 DIAGNOSIS — L718 Other rosacea: Secondary | ICD-10-CM | POA: Diagnosis not present

## 2022-03-21 DIAGNOSIS — H16223 Keratoconjunctivitis sicca, not specified as Sjogren's, bilateral: Secondary | ICD-10-CM | POA: Diagnosis not present

## 2022-03-21 DIAGNOSIS — H0288A Meibomian gland dysfunction right eye, upper and lower eyelids: Secondary | ICD-10-CM | POA: Diagnosis not present

## 2022-05-02 DIAGNOSIS — H0288B Meibomian gland dysfunction left eye, upper and lower eyelids: Secondary | ICD-10-CM | POA: Diagnosis not present

## 2022-05-02 DIAGNOSIS — H16223 Keratoconjunctivitis sicca, not specified as Sjogren's, bilateral: Secondary | ICD-10-CM | POA: Diagnosis not present

## 2022-05-02 DIAGNOSIS — L718 Other rosacea: Secondary | ICD-10-CM | POA: Diagnosis not present

## 2022-05-02 DIAGNOSIS — H0288A Meibomian gland dysfunction right eye, upper and lower eyelids: Secondary | ICD-10-CM | POA: Diagnosis not present

## 2022-05-18 DIAGNOSIS — E782 Mixed hyperlipidemia: Secondary | ICD-10-CM | POA: Diagnosis not present

## 2022-05-18 DIAGNOSIS — Z125 Encounter for screening for malignant neoplasm of prostate: Secondary | ICD-10-CM | POA: Diagnosis not present

## 2022-05-18 DIAGNOSIS — E538 Deficiency of other specified B group vitamins: Secondary | ICD-10-CM | POA: Diagnosis not present

## 2022-05-25 DIAGNOSIS — Z0000 Encounter for general adult medical examination without abnormal findings: Secondary | ICD-10-CM | POA: Diagnosis not present

## 2022-05-25 DIAGNOSIS — R739 Hyperglycemia, unspecified: Secondary | ICD-10-CM | POA: Diagnosis not present

## 2022-05-25 DIAGNOSIS — Z23 Encounter for immunization: Secondary | ICD-10-CM | POA: Diagnosis not present

## 2022-05-25 DIAGNOSIS — E782 Mixed hyperlipidemia: Secondary | ICD-10-CM | POA: Diagnosis not present

## 2022-05-25 DIAGNOSIS — E538 Deficiency of other specified B group vitamins: Secondary | ICD-10-CM | POA: Diagnosis not present

## 2022-05-25 DIAGNOSIS — Z125 Encounter for screening for malignant neoplasm of prostate: Secondary | ICD-10-CM | POA: Diagnosis not present

## 2022-07-11 DIAGNOSIS — H0288B Meibomian gland dysfunction left eye, upper and lower eyelids: Secondary | ICD-10-CM | POA: Diagnosis not present

## 2022-07-11 DIAGNOSIS — L718 Other rosacea: Secondary | ICD-10-CM | POA: Diagnosis not present

## 2022-07-11 DIAGNOSIS — H16223 Keratoconjunctivitis sicca, not specified as Sjogren's, bilateral: Secondary | ICD-10-CM | POA: Diagnosis not present

## 2022-07-11 DIAGNOSIS — H0288A Meibomian gland dysfunction right eye, upper and lower eyelids: Secondary | ICD-10-CM | POA: Diagnosis not present

## 2022-07-12 ENCOUNTER — Encounter
Admission: EM | Disposition: A | Discharge status: Home / Self Care | Payer: Self-pay | Source: Home / Self Care | Attending: Emergency Medicine

## 2022-07-12 ENCOUNTER — Observation Stay: Payer: PPO | Admitting: Anesthesiology

## 2022-07-12 ENCOUNTER — Emergency Department: Payer: PPO

## 2022-07-12 ENCOUNTER — Observation Stay
Admission: EM | Admit: 2022-07-12 | Discharge: 2022-07-13 | Disposition: A | Discharge status: Home / Self Care | Payer: PPO | Attending: Surgery | Admitting: Surgery

## 2022-07-12 ENCOUNTER — Other Ambulatory Visit: Payer: Self-pay

## 2022-07-12 DIAGNOSIS — K760 Fatty (change of) liver, not elsewhere classified: Secondary | ICD-10-CM | POA: Diagnosis not present

## 2022-07-12 DIAGNOSIS — Z79899 Other long term (current) drug therapy: Secondary | ICD-10-CM | POA: Insufficient documentation

## 2022-07-12 DIAGNOSIS — E039 Hypothyroidism, unspecified: Secondary | ICD-10-CM | POA: Insufficient documentation

## 2022-07-12 DIAGNOSIS — R1111 Vomiting without nausea: Secondary | ICD-10-CM | POA: Diagnosis not present

## 2022-07-12 DIAGNOSIS — R1013 Epigastric pain: Secondary | ICD-10-CM | POA: Diagnosis not present

## 2022-07-12 DIAGNOSIS — K8000 Calculus of gallbladder with acute cholecystitis without obstruction: Secondary | ICD-10-CM | POA: Diagnosis not present

## 2022-07-12 DIAGNOSIS — Z85828 Personal history of other malignant neoplasm of skin: Secondary | ICD-10-CM | POA: Insufficient documentation

## 2022-07-12 DIAGNOSIS — K8020 Calculus of gallbladder without cholecystitis without obstruction: Secondary | ICD-10-CM | POA: Diagnosis not present

## 2022-07-12 DIAGNOSIS — I10 Essential (primary) hypertension: Secondary | ICD-10-CM | POA: Insufficient documentation

## 2022-07-12 DIAGNOSIS — K5730 Diverticulosis of large intestine without perforation or abscess without bleeding: Secondary | ICD-10-CM | POA: Diagnosis not present

## 2022-07-12 DIAGNOSIS — Z7982 Long term (current) use of aspirin: Secondary | ICD-10-CM | POA: Diagnosis not present

## 2022-07-12 DIAGNOSIS — R0789 Other chest pain: Secondary | ICD-10-CM | POA: Diagnosis not present

## 2022-07-12 DIAGNOSIS — R1011 Right upper quadrant pain: Secondary | ICD-10-CM | POA: Diagnosis not present

## 2022-07-12 DIAGNOSIS — K810 Acute cholecystitis: Secondary | ICD-10-CM | POA: Diagnosis not present

## 2022-07-12 DIAGNOSIS — K8010 Calculus of gallbladder with chronic cholecystitis without obstruction: Principal | ICD-10-CM | POA: Insufficient documentation

## 2022-07-12 DIAGNOSIS — R079 Chest pain, unspecified: Secondary | ICD-10-CM | POA: Diagnosis not present

## 2022-07-12 LAB — TROPONIN I (HIGH SENSITIVITY)
Troponin I (High Sensitivity): 6 ng/L (ref <18)
Troponin I (High Sensitivity): 5 ng/L (ref <18)

## 2022-07-12 LAB — COMPREHENSIVE METABOLIC PANEL
ALT: 31 U/L (ref 0–44)
AST: 30 U/L (ref 15–41)
Albumin: 4.5 g/dL (ref 3.5–5.0)
Alkaline Phosphatase: 69 U/L (ref 38–126)
Anion gap: 10 (ref 5–15)
BUN: 19 mg/dL (ref 8–23)
CO2: 24 mmol/L (ref 22–32)
Calcium: 8.9 mg/dL (ref 8.9–10.3)
Chloride: 101 mmol/L (ref 98–111)
Creatinine, Ser: 1.38 mg/dL — ABNORMAL HIGH (ref 0.61–1.24)
GFR, Estimated: 54 mL/min — ABNORMAL LOW (ref >60)
Glucose, Bld: 164 mg/dL — ABNORMAL HIGH (ref 70–99)
Potassium: 3.4 mmol/L — ABNORMAL LOW (ref 3.5–5.1)
Sodium: 135 mmol/L (ref 135–145)
Total Bilirubin: 1.4 mg/dL — ABNORMAL HIGH (ref 0.3–1.2)
Total Protein: 7.4 g/dL (ref 6.5–8.1)

## 2022-07-12 LAB — URINALYSIS, ROUTINE W REFLEX MICROSCOPIC
Bacteria, UA: NONE SEEN
Bilirubin Urine: NEGATIVE
Glucose, UA: NEGATIVE mg/dL
Ketones, ur: NEGATIVE mg/dL
Leukocytes,Ua: NEGATIVE
Nitrite: NEGATIVE
Protein, ur: NEGATIVE mg/dL
Specific Gravity, Urine: 1.01 (ref 1.005–1.030)
Squamous Epithelial / HPF: NONE SEEN /HPF (ref 0–5)
pH: 7 (ref 5.0–8.0)

## 2022-07-12 LAB — CBC WITH DIFFERENTIAL/PLATELET
Abs Immature Granulocytes: 0.02 10*3/uL (ref 0.00–0.07)
Basophils Absolute: 0.1 10*3/uL (ref 0.0–0.1)
Basophils Relative: 1 %
Eosinophils Absolute: 0.2 10*3/uL (ref 0.0–0.5)
Eosinophils Relative: 3 %
HCT: 49.5 % (ref 39.0–52.0)
Hemoglobin: 16.2 g/dL (ref 13.0–17.0)
Immature Granulocytes: 0 %
Lymphocytes Relative: 30 %
Lymphs Abs: 2.1 10*3/uL (ref 0.7–4.0)
MCH: 28.6 pg (ref 26.0–34.0)
MCHC: 32.7 g/dL (ref 30.0–36.0)
MCV: 87.5 fL (ref 80.0–100.0)
Monocytes Absolute: 0.8 10*3/uL (ref 0.1–1.0)
Monocytes Relative: 11 %
Neutro Abs: 4.1 10*3/uL (ref 1.7–7.7)
Neutrophils Relative %: 55 %
Platelets: 191 10*3/uL (ref 150–400)
RBC: 5.66 MIL/uL (ref 4.22–5.81)
RDW: 13.6 % (ref 11.5–15.5)
WBC: 7.3 10*3/uL (ref 4.0–10.5)
nRBC: 0 % (ref 0.0–0.2)

## 2022-07-12 LAB — MAGNESIUM: Magnesium: 2 mg/dL (ref 1.7–2.4)

## 2022-07-12 LAB — LIPASE, BLOOD: Lipase: 35 U/L (ref 11–51)

## 2022-07-12 SURGERY — CHOLECYSTECTOMY, ROBOT-ASSISTED, LAPAROSCOPIC
Anesthesia: General

## 2022-07-12 MED ORDER — DEXAMETHASONE SODIUM PHOSPHATE 10 MG/ML IJ SOLN
INTRAMUSCULAR | Status: DC | PRN
  Administered 2022-07-12: 10 mg via INTRAVENOUS

## 2022-07-12 MED ORDER — OXYCODONE HCL 5 MG PO TABS
5.0000 mg | ORAL_TABLET | ORAL | Status: DC | PRN
  Filled 2022-07-12: qty 2

## 2022-07-12 MED ORDER — BUPIVACAINE LIPOSOME 1.3 % IJ SUSP
INTRAMUSCULAR | Status: AC
  Filled 2022-07-12: qty 20

## 2022-07-12 MED ORDER — INDOCYANINE GREEN 25 MG IV SOLR
2.5000 mg | INTRAVENOUS | Status: AC
  Administered 2022-07-12: 2.5 mg via INTRAVENOUS
  Filled 2022-07-12: qty 1

## 2022-07-12 MED ORDER — HYDROMORPHONE HCL 1 MG/ML IJ SOLN
0.5000 mg | Freq: Once | INTRAMUSCULAR | Status: AC
  Administered 2022-07-12: 0.5 mg via INTRAVENOUS
  Filled 2022-07-12: qty 0.5

## 2022-07-12 MED ORDER — LACTATED RINGERS IV BOLUS
1000.0000 mL | Freq: Once | INTRAVENOUS | Status: AC
  Administered 2022-07-12: 1000 mL via INTRAVENOUS

## 2022-07-12 MED ORDER — ONDANSETRON HCL 4 MG/2ML IJ SOLN: 4.0000 mg | Freq: Once | INTRAMUSCULAR | Status: DC | PRN

## 2022-07-12 MED ORDER — ONDANSETRON HCL 4 MG/2ML IJ SOLN
INTRAMUSCULAR | Status: DC | PRN
  Administered 2022-07-12: 4 mg via INTRAVENOUS

## 2022-07-12 MED ORDER — LACTATED RINGERS IV SOLN: INTRAVENOUS | Status: DC | PRN

## 2022-07-12 MED ORDER — FENTANYL CITRATE PF 50 MCG/ML IJ SOSY
50.0000 ug | PREFILLED_SYRINGE | Freq: Once | INTRAMUSCULAR | Status: AC
  Administered 2022-07-12: 50 ug via INTRAVENOUS
  Filled 2022-07-12: qty 1

## 2022-07-12 MED ORDER — ACETAMINOPHEN 500 MG PO TABS
1000.0000 mg | ORAL_TABLET | Freq: Four times a day (QID) | ORAL | Status: DC
  Administered 2022-07-12 – 2022-07-13 (×4): 1000 mg via ORAL
  Filled 2022-07-12 (×4): qty 2

## 2022-07-12 MED ORDER — EPINEPHRINE PF 1 MG/ML IJ SOLN
INTRAMUSCULAR | Status: AC
  Filled 2022-07-12: qty 1

## 2022-07-12 MED ORDER — FENTANYL CITRATE (PF) 100 MCG/2ML IJ SOLN
INTRAMUSCULAR | Status: AC
  Filled 2022-07-12: qty 2

## 2022-07-12 MED ORDER — BUPIVACAINE-EPINEPHRINE (PF) 0.25% -1:200000 IJ SOLN
INTRAMUSCULAR | Status: DC | PRN
  Administered 2022-07-12: 30 mL

## 2022-07-12 MED ORDER — FENTANYL CITRATE (PF) 100 MCG/2ML IJ SOLN
INTRAMUSCULAR | Status: DC | PRN
  Administered 2022-07-12 (×2): 50 ug via INTRAVENOUS

## 2022-07-12 MED ORDER — LIDOCAINE HCL (PF) 2 % IJ SOLN
INTRAMUSCULAR | Status: AC
  Filled 2022-07-12: qty 5

## 2022-07-12 MED ORDER — ONDANSETRON 4 MG PO TBDP: 4.0000 mg | ORAL_TABLET | Freq: Four times a day (QID) | ORAL | Status: DC | PRN

## 2022-07-12 MED ORDER — SUGAMMADEX SODIUM 200 MG/2ML IV SOLN
INTRAVENOUS | Status: DC | PRN
  Administered 2022-07-12: 200 mg via INTRAVENOUS

## 2022-07-12 MED ORDER — PROPOFOL 10 MG/ML IV BOLUS
INTRAVENOUS | Status: DC | PRN
  Administered 2022-07-12: 150 mg via INTRAVENOUS

## 2022-07-12 MED ORDER — FENTANYL CITRATE (PF) 100 MCG/2ML IJ SOLN
25.0000 ug | INTRAMUSCULAR | Status: DC | PRN
  Administered 2022-07-12: 50 ug via INTRAVENOUS
  Administered 2022-07-12 (×2): 25 ug via INTRAVENOUS

## 2022-07-12 MED ORDER — OXYCODONE HCL 5 MG PO TABS: 5.0000 mg | ORAL_TABLET | Freq: Once | ORAL | Status: DC | PRN

## 2022-07-12 MED ORDER — LACTATED RINGERS IV SOLN: INTRAVENOUS | Status: DC

## 2022-07-12 MED ORDER — ROCURONIUM BROMIDE 100 MG/10ML IV SOLN
INTRAVENOUS | Status: DC | PRN
  Administered 2022-07-12: 60 mg via INTRAVENOUS

## 2022-07-12 MED ORDER — ONDANSETRON HCL 4 MG/2ML IJ SOLN: 4.0000 mg | Freq: Four times a day (QID) | INTRAMUSCULAR | Status: DC | PRN

## 2022-07-12 MED ORDER — SODIUM CHLORIDE 0.9 % IV SOLN: INTRAVENOUS | Status: DC

## 2022-07-12 MED ORDER — ONDANSETRON HCL 4 MG/2ML IJ SOLN
INTRAMUSCULAR | Status: AC
  Filled 2022-07-12: qty 2

## 2022-07-12 MED ORDER — SCOPOLAMINE 1 MG/3DAYS TD PT72
MEDICATED_PATCH | TRANSDERMAL | Status: AC
  Filled 2022-07-12: qty 1

## 2022-07-12 MED ORDER — MORPHINE SULFATE (PF) 2 MG/ML IV SOLN: 2.0000 mg | INTRAVENOUS | Status: DC | PRN

## 2022-07-12 MED ORDER — 0.9 % SODIUM CHLORIDE (POUR BTL) OPTIME
TOPICAL | Status: DC | PRN
  Administered 2022-07-12: 500 mL

## 2022-07-12 MED ORDER — IOHEXOL 300 MG/ML  SOLN
100.0000 mL | Freq: Once | INTRAMUSCULAR | Status: AC | PRN
  Administered 2022-07-12: 100 mL via INTRAVENOUS

## 2022-07-12 MED ORDER — BUPIVACAINE HCL (PF) 0.25 % IJ SOLN
INTRAMUSCULAR | Status: AC
  Filled 2022-07-12: qty 30

## 2022-07-12 MED ORDER — ROCURONIUM BROMIDE 10 MG/ML (PF) SYRINGE
PREFILLED_SYRINGE | INTRAVENOUS | Status: AC
  Filled 2022-07-12: qty 10

## 2022-07-12 MED ORDER — OXYCODONE HCL 5 MG/5ML PO SOLN: 5.0000 mg | Freq: Once | ORAL | Status: DC | PRN

## 2022-07-12 MED ORDER — LIDOCAINE HCL (CARDIAC) PF 100 MG/5ML IV SOSY
PREFILLED_SYRINGE | INTRAVENOUS | Status: DC | PRN
  Administered 2022-07-12: 100 mg via INTRAVENOUS

## 2022-07-12 MED ORDER — DEXAMETHASONE SODIUM PHOSPHATE 10 MG/ML IJ SOLN
INTRAMUSCULAR | Status: AC
  Filled 2022-07-12: qty 1

## 2022-07-12 MED ORDER — SCOPOLAMINE 1 MG/3DAYS TD PT72
1.0000 | MEDICATED_PATCH | TRANSDERMAL | Status: DC
  Administered 2022-07-12: 1.5 mg via TRANSDERMAL

## 2022-07-12 MED ORDER — MORPHINE SULFATE (PF) 4 MG/ML IV SOLN
4.0000 mg | Freq: Once | INTRAVENOUS | Status: AC
  Administered 2022-07-12: 4 mg via INTRAVENOUS
  Filled 2022-07-12: qty 1

## 2022-07-12 MED ORDER — KETOROLAC TROMETHAMINE 30 MG/ML IJ SOLN: 15.0000 mg | Freq: Once | INTRAMUSCULAR | Status: DC

## 2022-07-12 MED ORDER — ACETAMINOPHEN 10 MG/ML IV SOLN: 1000.0000 mg | Freq: Once | INTRAVENOUS | Status: DC | PRN

## 2022-07-12 MED ORDER — ONDANSETRON HCL 4 MG/2ML IJ SOLN
4.0000 mg | Freq: Once | INTRAMUSCULAR | Status: AC
  Administered 2022-07-12: 4 mg via INTRAVENOUS
  Filled 2022-07-12: qty 2

## 2022-07-12 MED ORDER — CIPROFLOXACIN IN D5W 400 MG/200ML IV SOLN
400.0000 mg | Freq: Two times a day (BID) | INTRAVENOUS | Status: DC
  Administered 2022-07-12 (×2): 400 mg via INTRAVENOUS
  Filled 2022-07-12 (×3): qty 200

## 2022-07-12 SURGICAL SUPPLY — 45 items
BAG PRESSURE INF REUSE 3000 (BAG) IMPLANT
CLIP LIGATING HEM O LOK PURPLE (MISCELLANEOUS) ×1 IMPLANT
COVER TIP SHEARS 8 DVNC (MISCELLANEOUS) ×1 IMPLANT
DERMABOND ADVANCED .7 DNX12 (GAUZE/BANDAGES/DRESSINGS) ×1 IMPLANT
DRAPE ARM DVNC X/XI (DISPOSABLE) ×4 IMPLANT
DRAPE COLUMN DVNC XI (DISPOSABLE) ×1 IMPLANT
ELECT CAUTERY BLADE 6.4 (BLADE) ×1 IMPLANT
FORCEPS BPLR R/ABLATION 8 DVNC (INSTRUMENTS) ×1 IMPLANT
FORCEPS PROGRASP DVNC XI (FORCEP) ×1 IMPLANT
GLOVE ORTHO TXT STRL SZ7.5 (GLOVE) ×2 IMPLANT
GOWN STRL REUS W/ TWL LRG LVL3 (GOWN DISPOSABLE) ×2 IMPLANT
GOWN STRL REUS W/ TWL XL LVL3 (GOWN DISPOSABLE) ×2 IMPLANT
GOWN STRL REUS W/TWL LRG LVL3 (GOWN DISPOSABLE) ×2
GOWN STRL REUS W/TWL XL LVL3 (GOWN DISPOSABLE) ×2
GRASPER SUT TROCAR 14GX15 (MISCELLANEOUS) ×1 IMPLANT
IRRIGATION STRYKERFLOW (MISCELLANEOUS) IMPLANT
IRRIGATOR STRYKERFLOW (MISCELLANEOUS)
IRRIGATOR SUCT 8 DISP DVNC XI (IRRIGATION / IRRIGATOR) IMPLANT
IV NS IRRIG 3000ML ARTHROMATIC (IV SOLUTION) IMPLANT
KIT PINK PAD W/HEAD ARE REST (MISCELLANEOUS) ×1 IMPLANT
KIT PINK PAD W/HEAD ARM REST (MISCELLANEOUS) ×1 IMPLANT
KIT TURNOVER KIT A (KITS) ×1 IMPLANT
LABEL OR SOLS (LABEL) ×1 IMPLANT
MANIFOLD NEPTUNE II (INSTRUMENTS) ×1 IMPLANT
NDL HYPO 22X1.5 SAFETY MO (MISCELLANEOUS) ×1 IMPLANT
NDL INSUFFLATION 14GA 120MM (NEEDLE) ×1 IMPLANT
NEEDLE HYPO 22X1.5 SAFETY MO (MISCELLANEOUS) ×1 IMPLANT
NEEDLE INSUFFLATION 14GA 120MM (NEEDLE) ×1 IMPLANT
NS IRRIG 500ML POUR BTL (IV SOLUTION) ×1 IMPLANT
PACK LAP CHOLECYSTECTOMY (MISCELLANEOUS) ×1 IMPLANT
SCISSORS MNPLR CVD DVNC XI (INSTRUMENTS) ×1 IMPLANT
SEAL UNIV 5-12 XI (MISCELLANEOUS) ×4 IMPLANT
SET TUBE SMOKE EVAC HIGH FLOW (TUBING) ×1 IMPLANT
SOL ELECTROSURG ANTI STICK (MISCELLANEOUS) ×1
SOLUTION ELECTROSURG ANTI STCK (MISCELLANEOUS) ×1 IMPLANT
SPIKE FLUID TRANSFER (MISCELLANEOUS) ×1 IMPLANT
SUT MNCRL 4-0 (SUTURE) ×1
SUT MNCRL 4-0 27XMFL (SUTURE) ×1
SUT VICRYL 0 UR6 27IN ABS (SUTURE) ×1 IMPLANT
SUTURE MNCRL 4-0 27XMF (SUTURE) ×1 IMPLANT
SYS BAG RETRIEVAL 10MM (BASKET) ×1
SYSTEM BAG RETRIEVAL 10MM (BASKET) ×1 IMPLANT
TRAP FLUID SMOKE EVACUATOR (MISCELLANEOUS) ×1 IMPLANT
TROCAR Z-THREAD FIOS 11X100 BL (TROCAR) ×1 IMPLANT
WATER STERILE IRR 500ML POUR (IV SOLUTION) ×1 IMPLANT

## 2022-07-12 NOTE — Anesthesia Procedure Notes (Signed)
Procedure Name: Intubation Date/Time: 07/12/2022 3:35 PM  Performed by: Morene Crocker, CRNAPre-anesthesia Checklist: Patient identified, Patient being monitored, Timeout performed, Emergency Drugs available and Suction available Patient Re-evaluated:Patient Re-evaluated prior to induction Oxygen Delivery Method: Circle system utilized Preoxygenation: Pre-oxygenation with 100% oxygen Induction Type: IV induction Ventilation: Two handed mask ventilation required and Oral airway inserted - appropriate to patient size Laryngoscope Size: Mac and 3 Grade View: Grade I Tube type: Oral Tube size: 7.5 mm Number of attempts: 1 Airway Equipment and Method: Stylet Placement Confirmation: ETT inserted through vocal cords under direct vision, positive ETCO2 and breath sounds checked- equal and bilateral Secured at: 22 cm Tube secured with: Tape Dental Injury: Teeth and Oropharynx as per pre-operative assessment  Comments: Smooth atraumatic intubation, no complication noted.

## 2022-07-12 NOTE — Op Note (Signed)
Robotic cholecystectomy with Indocyamine Green Ductal Imaging.   Pre-operative Diagnosis: Acute calculus cholecystitis  Post-operative Diagnosis:  Same.  Procedure: Robotic assisted laparoscopic cholecystectomy with Indocyamine Green Ductal Imaging.   Surgeon: [name], M.D., FACS  Anesthesia: General. with endotracheal tube  Findings: edematous GB.   Estimated Blood Loss: 15 mL         Drains: None         Specimens: Gallbladder           Complications: none  Procedure Details  The patient was seen again in the Holding Room.  1.25 mg dose of ICG was administered intravenously.   The benefits, complications, treatment options, risks and expected outcomes were again reviewed with the patient. The likelihood of improving the patient's symptoms with return to their baseline status is good.  The patient and/or family concurred with the proposed plan, giving informed consent, again alternatives reviewed.  The patient was taken to Operating Room, identified, and the procedure verified as robotic assisted laparoscopic cholecystectomy.  Prior to the induction of general anesthesia, antibiotic prophylaxis was administered. VTE prophylaxis was in place. General endotracheal anesthesia was then administered and tolerated well. The patient was positioned in the supine position.  After the induction, the abdomen was prepped with Chloraprep and draped in the sterile fashion.  A Time Out was held and the above information confirmed.  After local infiltration of quarter percent Marcaine with epinephrine, stab incision was made left upper quadrant.  Just below the costal margin at Palmer's point, approximately midclavicular line the Veres needle is passed with sensation of the layers to penetrate the abdominal wall and into the peritoneum.  Saline drop test is confirmed peritoneal placement.  Insufflation is initiated with carbon dioxide to pressures of 15 mmHg.  Right para-umbilical local  infiltration with quarter percent Marcaine with epinephrine is utilized.  Made a 12 mm incision on the right periumbilical site, I advanced an optical 11mm port under direct visualization into the peritoneal cavity.  Once the peritoneum was penetrated, insufflation was initiated.  The trocar was then advanced into the abdominal cavity under direct visualization. Pneumoperitoneum was then continued utilizing CO2 at 15 mmHg or less and tolerated well without any adverse changes in the patient's vital signs.  Two 8.5-mm ports were placed in the left lower quadrant and laterally, and one to the right lower quadrant, all under direct vision. All skin incisions  were infiltrated with a local anesthetic agent before making the incision and placing the trocars.  The patient was positioned  in reverse Trendelenburg, tilted the patient's left side down.  Da Vinci XI robot was then positioned on to the patient's left side, and docked.  The gallbladder was identified, the fundus grasped via the arm 4 Prograsp and retracted cephalad. Adhesions were lysed with scissors and cautery.  The infundibulum was identified grasped and retracted laterally, exposing the peritoneum overlying the triangle of Calot. This was then opened and dissected using cautery & scissors. An extended critical view of the cystic duct and cystic artery was obtained, aided by the ICG via FireFly which improved localization of the ductal anatomy.    The cystic duct was clearly identified and dissected to isolation.   Artery well isolated and clipped, and the cystic duct was triple clipped and divided with scissors, as close to the gallbladder neck as feasible, thus leaving two on the remaining stump.  The specimen side of the artery is sealed with bipolar and divided with monopolar scissors.  The gallbladder was taken from the gallbladder fossa in a retrograde fashion with the electrocautery. The gallbladder was removed and placed in an Endocatch bag.   The liver bed is inspected. Hemostasis was confirmed.  The robot was undocked and moved away from the operative field. No irrigation was utilized. The gallbladder and Endocatch sac were then removed through the infraumbilical port site.   Inspection of the right upper quadrant was performed. No bleeding, bile duct injury or leak, or bowel injury was noted. The infra-umbilical port site fascia was closed with interrumpted 0 Vicryl sutures using PMI/cone under direct visualization. Pneumoperitoneum was released and ports removed.  4-0 subcuticular Monocryl was used to close the skin. Dermabond was  applied.  The patient was then extubated and brought to the recovery room in stable condition. Sponge, lap, and needle counts were correct at closure and at the conclusion of the case.               [name], M.D., [facility] 07/12/2022 4:39 PM

## 2022-07-12 NOTE — ED Provider Notes (Signed)
[facility] Provider Note    Event Date/Time   First MD Initiated Contact with Patient 07/12/22 0407     (approximate)   History   Abdominal Pain and Chest Pain   HPI  Don Holder is a 72 y.o. male who presents to the ED for evaluation of Abdominal Pain and Chest Pain   I reviewed PCP visit from 4/4.  Routine annual.  History of HTN, HLD, hypothyroidism. Patient self-reports a history of remote appendectomy but no other intra-abdominal surgical history.  Patient presents to the ED from home via EMS for evaluation of epigastric discomfort.  Was "normal" last night when he went to bed after a large dinner but no postprandial symptoms.  Reports awakening around 1 or 2 AM with epigastric discomfort.  He reports "pain around the diaphragm."  On route to the ED, he reports developing nausea and emesis of nonbloody nonbilious emesis.  He reports his pain is now migrating inferiorly somewhat to the periumbilical abdomen.  This has never happened before.  Denies any chest pain, dyspnea or syncope.  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  Reports driving fence posts into the ground yesterday afternoon manually without any chest discomfort, nausea, abdominal discomfort or any concerns.   Physical Exam   Triage Vital Signs: ED Triage Vitals  Enc Vitals Group     BP      Pulse      Resp      Temp      Temp src      SpO2      Weight      Height      Head Circumference      Peak Flow      Pain Score      Pain Loc      Pain Edu?      Excl. in GC?     Most recent vital signs: Vitals:   07/12/22 0414 07/12/22 0415  BP:  128/72  Pulse: 67 63  Resp:  18  Temp:  97.7 F (36.5 C)  SpO2:  97%    General: Awake, no distress.  Nonbloody nonbilious emesis in the EMS stretcher rolling into his room.  After antiemetics, he looks more comfortable and is conversational CV:  Good peripheral perfusion.  Resp:  Normal  effort.  Abd:  No distention.  Epigastric discomfort and mild tenderness without peritoneal features.  Lower abdomen is benign MSK:  No deformity noted.  Neuro:  No focal deficits appreciated. Other:     ED Results / Procedures / Treatments   Labs (all labs ordered are listed, but only abnormal results are displayed) Labs Reviewed  COMPREHENSIVE METABOLIC PANEL - Abnormal; Notable for the following components:      Result Value   Potassium 3.4 (*)    Glucose, Bld 164 (*)    Creatinine, Ser 1.38 (*)    Total Bilirubin 1.4 (*)    GFR, Estimated 54 (*)    All other components within normal limits  URINALYSIS, ROUTINE W REFLEX MICROSCOPIC - Abnormal; Notable for the following components:   Color, Urine STRAW (*)    APPearance CLEAR (*)    Hgb urine dipstick SMALL (*)    All other components within normal limits  CBC WITH DIFFERENTIAL/PLATELET  MAGNESIUM  LIPASE, BLOOD  TROPONIN I (HIGH SENSITIVITY)  TROPONIN I (HIGH SENSITIVITY)    EKG Wandering baseline demonstrates a sinus rhythm at a rate of  69 bpm.  Normal axis and intervals.  No clear signs of acute ischemia.  RADIOLOGY CXR interpreted by me without evidence of acute cardiopulmonary pathology. CT abdomen/pelvis interpreted by me with gallstones  Official radiology report(s): CT ABDOMEN PELVIS W CONTRAST  Result Date: 07/12/2022 CLINICAL DATA:  Epigastric and abdominal pain. EXAM: CT ABDOMEN AND PELVIS WITH CONTRAST TECHNIQUE: Multidetector CT imaging of the abdomen and pelvis was performed using the standard protocol following bolus administration of intravenous contrast. RADIATION DOSE REDUCTION: This exam was performed according to the departmental dose-optimization program which includes automated exposure control, adjustment of the mA and/or kV according to patient size and/or use of iterative reconstruction technique. CONTRAST:  OMNIPAQUE IOHEXOL 300 MG/ML  SOLN COMPARISON:  None Available. FINDINGS: Lower chest:  Chest unremarkable. Hepatobiliary: 2.2 cm left hepatic cyst. No followup imaging is recommended. Dependent tiny calcified gallstones evident. No substantial pericholecystic fluid. No intrahepatic or extrahepatic biliary dilation. Pancreas: No focal mass lesion. No dilatation of the main duct. No intraparenchymal cyst. No peripancreatic edema. Spleen: No splenomegaly. No focal mass lesion. Adrenals/Urinary Tract: No adrenal nodule or mass. No suspicious renal mass. Central sinus cysts are noted in both kidneys. No followup imaging is recommended. No evidence for hydroureter. The urinary bladder appears normal for the degree of distention. Stomach/Bowel: Stomach is unremarkable. No gastric wall thickening. No evidence of outlet obstruction. Duodenum is normally positioned as is the ligament of Treitz. No small bowel wall thickening. No small bowel dilatation. The terminal ileum is normal. The appendix is not well visualized, but there is no edema or inflammation in the region of the cecum. No gross colonic mass. No colonic wall thickening. Diverticular changes are noted in the left colon without evidence of diverticulitis. Vascular/Lymphatic: There is moderate atherosclerotic calcification of the abdominal aorta without aneurysm. There is no gastrohepatic or hepatoduodenal ligament lymphadenopathy. No retroperitoneal or mesenteric lymphadenopathy. No pelvic sidewall lymphadenopathy. Reproductive: The prostate gland and seminal vesicles are unremarkable. Other: No intraperitoneal free fluid. Musculoskeletal: No worrisome lytic or sclerotic osseous abnormality. Bilateral pars interarticularis defects noted at L5. IMPRESSION: 1. No acute findings in the abdomen or pelvis. Specifically, no findings to explain the patient's history of epigastric pain. 2. Cholelithiasis. 3. Left colonic diverticulosis without diverticulitis. 4. Bilateral pars interarticularis defects at L5. 5.  Aortic Atherosclerosis (ICD10-I70.0).  Electronically Signed   By: Kennith Center M.D.   On: 07/12/2022 05:49   DG Chest Portable 1 View  Result Date: 07/12/2022 CLINICAL DATA:  Epigastric pain EXAM: PORTABLE CHEST 1 VIEW COMPARISON:  12/27/2013 FINDINGS: Generous heart size accentuated by portable technique. Borderline central vessel distension. There is no edema, consolidation, effusion, or pneumothorax. Artifact from EKG leads. IMPRESSION: No acute finding. Electronically Signed   By: Tiburcio Pea M.D.   On: 07/12/2022 05:48    PROCEDURES and INTERVENTIONS:  .1-3 Lead EKG Interpretation  Performed by: Delton Prairie, MD Authorized by: Delton Prairie, MD     Interpretation: normal     ECG rate:  64   ECG rate assessment: normal     Rhythm: sinus rhythm     Ectopy: none     Conduction: normal     Medications  HYDROmorphone (DILAUDID) injection 0.5 mg (has no administration in time range)  lactated ringers bolus 1,000 mL (1,000 mLs Intravenous New Bag/Given 07/12/22 0441)  ondansetron (ZOFRAN) injection 4 mg (4 mg Intravenous Given 07/12/22 0441)  morphine (PF) 4 MG/ML injection 4 mg (4 mg Intravenous Given 07/12/22 0443)  iohexol (OMNIPAQUE) 300 MG/ML  solution 100 mL (100 mLs Intravenous Contrast Given 07/12/22 0522)  fentaNYL (SUBLIMAZE) injection 50 mcg (50 mcg Intravenous Given 07/12/22 0548)     IMPRESSION / MDM / ASSESSMENT AND PLAN / ED COURSE  I reviewed the triage vital signs and the nursing notes.  Differential diagnosis includes, but is not limited to, ACS, biliary colic, choledocholithiasis, pancreatitis, pneumothorax  {Patient presents with symptoms of an acute illness or injury that is potentially life-threatening.  Generally healthy 72 year old presents to the ED with upper abdominal pain and emesis with evidence of gallstones.  He has persistent pain despite IV analgesics.  He has a normal CBC, metabolic panel with marginal elevation of bilirubin but no further stigmata of hepatobiliary obstruction.  Normal  lipase and first troponin.  EKG is reassuring and I doubt ACS, but we will continue to monitor and trend troponins.  Awaiting RUQ ultrasound the time of signout  Clinical Course as of 07/12/22 0637  Wed Jul 12, 2022  2841 Reassessed and discussed CT results my concern for gallstones.  Discussed my recommendation for ultrasound and he is agreeable.  He reports continued pain and is uncomfortable.  His daughter and wife are now at the bedside.  We discussed gallstones and plan of care. [DS]    Clinical Course User Index [DS] Delton Prairie, MD     FINAL CLINICAL IMPRESSION(S) / ED DIAGNOSES   Final diagnoses:  Calculus of gallbladder without cholecystitis without obstruction     Rx / DC Orders   ED Discharge Orders     None        Note:  This document was prepared using Dragon voice recognition software and may include unintentional dictation errors.   Delton Prairie, MD 07/12/22 [phone]

## 2022-07-12 NOTE — Interval H&P Note (Signed)
History and Physical Interval Note:  07/12/2022 3:14 PM  Don Holder  has presented today for surgery, with the diagnosis of acute cholecystitis.  The various methods of treatment have been discussed with the patient and family. After consideration of risks, benefits and other options for treatment, the patient has consented to  Procedure(s): XI ROBOTIC ASSISTED LAPAROSCOPIC CHOLECYSTECTOMY (N/A) INDOCYANINE GREEN FLUORESCENCE IMAGING (ICG) (N/A) as a surgical intervention.  The patient's history has been reviewed, patient examined, no change in status, stable for surgery.  I have reviewed the patient's chart and labs.  Questions were answered to the patient's satisfaction.     Don Holder

## 2022-07-12 NOTE — Discharge Instructions (Signed)

## 2022-07-12 NOTE — ED Provider Notes (Signed)
I received signout on this patient who has ongoing pain, and a bit better controlled now with several doses of pain medications via IV.  His right upper quadrant ultrasound shows no infection but does show distended gallbladder with gallstones.  I consulted with Dr. Claudine Mouton of general surgery for evaluation given his symptomatic cholelithiasis.   Pilar Jarvis, MD 07/12/22 [phone]

## 2022-07-12 NOTE — H&P (Signed)
[facility] SURGICAL HISTORY & PHYSICAL (cpt [phone])  HISTORY OF PRESENT ILLNESS (HPI):  72 y.o. male presented to [facility] ED overnight for abdominal pain. Patient reports the acute onset of epigastric discomfort last night around 0100 which awoke him from sleep. He reports feeling normal before going to sleep. This has been a sharp and crampy pain. Accompanied with nausea and an episode of emesis. No fever, chills, cough, SOB, CP, urinary changes, or bowel changes. No history of similar pain in the past. Previous surgeries positive for appendectomy. Work up in the ED revealed a normal WBC at 7.3, renal function baseline with sCr - 1.38, mild hyperbilirubinemia to 1.4 (was 1.3 previous), no electrolyte derangements, lipase normal at 38. Imaging revealed cholelithiasis. Attempts at pain control were unsuccessful in ED.   General surgery is consulted by emergency medicine physician Dr Pilar Jarvis, MD for evaluation and management symptomatic cholelithiasis with intractable pain.   PAST MEDICAL HISTORY (PMH):  Past Medical History:  Diagnosis Date   Arthritis    Hyperlipidemia    Hypertension    Hypothyroidism    Wears hearing aid     Reviewed. Otherwise negative.   PAST SURGICAL HISTORY (PSH):  Past Surgical History:  Procedure Laterality Date   APPENDECTOMY     BACK SURGERY     COLONOSCOPY     COLONOSCOPY WITH PROPOFOL N/A 10/04/2020   Procedure: COLONOSCOPY WITH PROPOFOL;  Surgeon: Regis Bill, MD;  Location: [facility] ENDOSCOPY;  Service: Endoscopy;  Laterality: N/A;   EXCISION PARTIAL PHALANX Bilateral 03/23/2021   Procedure: EXCISION PARTIAL PHALANX - FOURTH TOES;  Surgeon: Gwyneth Revels, DPM;  Location: [facility];  Service: Podiatry;  Laterality: Bilateral;  Anesthesia: Choice   HAMMER TOE SURGERY Bilateral 03/23/2021   Procedure: HAMMER TOE CORRECTION - FIFTH TOES;  Surgeon: Gwyneth Revels, DPM;  Location: [facility];  Service: Podiatry;  Laterality:  Bilateral;   SKIN CANCER EXCISION      Reviewed. Otherwise negative.   MEDICATIONS:  Prior to Admission medications   Medication Sig Start Date End Date Taking? Authorizing Provider  amLODipine (NORVASC) 5 MG tablet Take 5 mg by mouth daily.    [provider]  aspirin 81 MG chewable tablet Chew by mouth daily.    [provider]  Cyanocobalamin (VITAMIN B 12) 500 MCG TABS Take 1,000 mcg by mouth.    [provider]  levothyroxine (SYNTHROID) 75 MCG tablet Take 75 mcg by mouth daily before breakfast.    [provider]  Multiple Vitamin (MULTIVITAMIN) capsule Take 1 capsule by mouth daily.    [provider]  olmesartan (BENICAR) 20 MG tablet Take 20 mg by mouth daily.    [provider]  oxyCODONE-acetaminophen (PERCOCET) 5-325 MG tablet Take 1-2 tablets by mouth every 6 (six) hours as needed for severe pain. Max 6 tabs per day 03/23/21   Gwyneth Revels, DPM  vitamin C (ASCORBIC ACID) 500 MG tablet Take 500 mg by mouth daily.    [provider]     ALLERGIES:  Allergies  Allergen Reactions   Niacin And Related    Penicillins     GI upset     SOCIAL HISTORY:  Social History   Socioeconomic History   Marital status: Married    Spouse name: Not on file   Number of children: Not on file   Years of education: Not on file   Highest education level: Not on file  Occupational History   Not on file  Tobacco Use   Smoking status: Never   Smokeless tobacco: Never  Vaping Use   Vaping Use: Never used  Substance and Sexual Activity   Alcohol use: Never   Drug use: Never   Sexual activity: Not on file  Other Topics Concern   Not on file  Social History Narrative   Not on file   Social Determinants of Health   Financial Resource Strain: Not on file  Food Insecurity: Not on file  Transportation Needs: Not on file  Physical Activity: Not on file  Stress: Not on file  Social Connections: Not on file  Intimate  Partner Violence: Not on file     FAMILY HISTORY:  History reviewed. No pertinent family history.  Otherwise negative.   REVIEW OF SYSTEMS:  Review of Systems  Constitutional:  Negative for chills and fever.  HENT:  Negative for congestion and sore throat.   Respiratory:  Negative for cough and shortness of breath.   Cardiovascular:  Negative for chest pain and palpitations.  Gastrointestinal:  Positive for abdominal pain, nausea and vomiting. Negative for constipation and diarrhea.  Genitourinary:  Negative for dysuria and urgency.  All other systems reviewed and are negative.   VITAL SIGNS:  Temp:  [97.7 F (36.5 C)] 97.7 F (36.5 C) (05/22 0841) Pulse Rate:  [60-71] 64 (05/22 0900) Resp:  [18] 18 (05/22 0900) BP: (100-136)/(66-78) 114/73 (05/22 0900) SpO2:  [92 %-98 %] 92 % (05/22 0900) Weight:  [92.1 kg] 92.1 kg (05/22 0411)     Height: 5\' 8"  (172.7 cm) Weight: 92.1 kg BMI (Calculated): 30.87   PHYSICAL EXAM:  Physical Exam Vitals and nursing note reviewed. Exam conducted with a chaperone present.  Constitutional:      General: He is not in acute distress.    Appearance: He is well-developed. He is obese. He is not ill-appearing.     Comments: Patient resting in bed; NAD. Wife at bedside   HENT:     Head: Normocephalic and atraumatic.  Eyes:     General: No scleral icterus.    Extraocular Movements: Extraocular movements intact.  Cardiovascular:     Rate and Rhythm: Normal rate and regular rhythm.  Pulmonary:     Effort: Pulmonary effort is normal. No respiratory distress.  Abdominal:     General: There is no distension.     Palpations: Abdomen is soft.     Tenderness: There is abdominal tenderness in the right upper quadrant and epigastric area. There is no guarding or rebound. Positive signs include Murphy's sign.     Comments: Abdomen is soft, RUQ/Epigastric tenderness, he does have a positive Murphy's Sign, non-distended, no rebound/guarding  Genitourinary:     Comments: Deferred Skin:    General: Skin is warm and dry.     Coloration: Skin is not jaundiced.  Neurological:     General: No focal deficit present.     Mental Status: He is alert and oriented to person, place, and time.  Psychiatric:        Mood and Affect: Mood normal.        Behavior: Behavior normal.     INTAKE/OUTPUT:  This shift: No intake/output data recorded.  Last 2 shifts: @IOLAST2SHIFTS @  Labs:     Latest Ref Rng & Units 07/12/2022    4:16 AM 12/27/2013    9:20 PM  CBC  WBC 4.0 - 10.5 K/uL 7.3  7.5   Hemoglobin 13.0 - 17.0 g/dL 69.6  29.5   Hematocrit 39.0 -  52.0 % 49.5  42.5   Platelets 150 - 400 K/uL 191  162       Latest Ref Rng & Units 07/12/2022    4:16 AM 12/27/2013    9:20 PM  CMP  Glucose 70 - 99 mg/dL 161  096   BUN 8 - 23 mg/dL 19  19   Creatinine 0.45 - 1.24 mg/dL 4.09  8.11   Sodium 914 - 145 mmol/L 135  140   Potassium 3.5 - 5.1 mmol/L 3.4  3.7   Chloride 98 - 111 mmol/L 101  108   CO2 22 - 32 mmol/L 24  25   Calcium 8.9 - 10.3 mg/dL 8.9  8.3   Total Protein 6.5 - 8.1 g/dL 7.4  6.8   Total Bilirubin 0.3 - 1.2 mg/dL 1.4  1.3   Alkaline Phos 38 - 126 U/L 69  70   AST 15 - 41 U/L 30  32   ALT 0 - 44 U/L 31  38      Imaging studies:   CT Abdomen/Pelvis (07/12/2022) personally reviewed showing cholelithiasis without gross evidence of cholecystitis, no other gross intra-abdominal findings, and radiologist report reviewed below:  IMPRESSION: 1. No acute findings in the abdomen or pelvis. Specifically, no findings to explain the patient's history of epigastric pain. 2. Cholelithiasis. 3. Left colonic diverticulosis without diverticulitis. 4. Bilateral pars interarticularis defects at L5. 5.  Aortic Atherosclerosis (ICD10-I70.0).   RUQ [place] (07/12/2022) personally reviewed with cholelithiasis without obvious wall thickening or pericholecystic fluid, and radiologist report reviewed below:  IMPRESSION: 1. Cholelithiasis. Full gallbladder but  no specific signs of acute cholecystitis. 2. Hepatic steatosis.   Assessment/Plan: (ICD-10's: K87.20) 72 y.o. male with symptomatic cholelithiasis and intractable pain    - Given unsuccessful attempts at pain control will plan on admission to general surgery - Plan for robotic assisted laparoscopic cholecystectomy with Dr Claudine Mouton this afternoon pending OR/Anesthesia availability   - All risks, benefits, and alternatives to above procedure(s) were discussed with the patient, all of his questions were answered to his expressed satisfaction, patient expresses he wishes to proceed, and informed consent was obtained.    - ICG on call to OR  - NPO + IVF  - IV Abx (Cipro) - Monitor abdominal examination; on-going bowel function - Pian control prn; antiemetics prn - Mobilize   - DVT prophylaxis; hold for OR  All of the above findings and recommendations were discussed with the patient and his family (wife at bedside), and all of their questions were answered to their expressed satisfaction.  -- Lynden Oxford, PA-C [facility] 07/12/2022, 9:16 AM M-F: 7am - 4pm

## 2022-07-12 NOTE — ED Triage Notes (Signed)
Pt states now he has 7/10 abdominal pain, lower than where it began as chest pain

## 2022-07-12 NOTE — ED Triage Notes (Signed)
Pt reports chest pain/epigastric pain starting at 0100 this morning. Pt took 324 ASA at home, EMS gave him 1.5 In nitro paste.Pt states that since he has thrown up he does not feel the pain at this time.

## 2022-07-12 NOTE — ED Notes (Signed)
Report given to pre-opNettie Elm, RN. Patient to go around 3pm per RN.

## 2022-07-12 NOTE — Anesthesia Preprocedure Evaluation (Addendum)
Anesthesia Evaluation  Patient identified by MRN, date of birth, ID band Patient awake    Reviewed: Allergy & Precautions, NPO status , Patient's Chart, lab work & pertinent test results  History of Anesthesia Complications (+) PONV and history of anesthetic complications  Airway Mallampati: II   Neck ROM: Full    Dental  (+) Missing   Pulmonary neg pulmonary ROS   Pulmonary exam normal breath sounds clear to auscultation       Cardiovascular hypertension, Normal cardiovascular exam Rhythm:Regular Rate:Normal  ECG 07/12/22: Sinus rhythm Low voltage, precordial leads Baseline wander in lead(s) II III aVL aVF V3   Neuro/Psych HOH    GI/Hepatic negative GI ROS, Neg liver ROS,,,  Endo/Other  Hypothyroidism  Obesity   Renal/GU negative Renal ROS     Musculoskeletal  (+) Arthritis ,    Abdominal   Peds  Hematology negative hematology ROS (+)   Anesthesia Other Findings   Reproductive/Obstetrics                             Anesthesia Physical Anesthesia Plan  ASA: 2  Anesthesia Plan: General   Post-op Pain Management:    Induction: Intravenous  PONV Risk Score and Plan: 3 and Ondansetron, Dexamethasone, Treatment may vary due to age or medical condition and Scopolamine patch - Pre-op  Airway Management Planned: Oral ETT  Additional Equipment:   Intra-op Plan:   Post-operative Plan: Extubation in OR  Informed Consent: I have reviewed the patients History and Physical, chart, labs and discussed the procedure including the risks, benefits and alternatives for the proposed anesthesia with the patient or authorized representative who has indicated his/her understanding and acceptance.     Dental advisory given  Plan Discussed with: CRNA  Anesthesia Plan Comments: (Patient consented for risks of anesthesia including but not limited to:  - adverse reactions to medications -  damage to eyes, teeth, lips or other oral mucosa - nerve damage due to positioning  - sore throat or hoarseness - damage to heart, brain, nerves, lungs, other parts of body or loss of life  Informed patient about role of CRNA in peri- and intra-operative care.  Patient voiced understanding.)        Anesthesia Quick Evaluation

## 2022-07-12 NOTE — ED Notes (Signed)
Patient resting comfortably at this time. NAD noted. Family at bedside. No needs expressed.

## 2022-07-12 NOTE — Transfer of Care (Signed)
Immediate Anesthesia Transfer of Care Note  Patient: Don Holder  Procedure(s) Performed: XI ROBOTIC ASSISTED LAPAROSCOPIC CHOLECYSTECTOMY INDOCYANINE GREEN FLUORESCENCE IMAGING (ICG)  Patient Location: PACU  Anesthesia Type:General  Level of Consciousness: awake and drowsy  Airway & Oxygen Therapy: Patient Spontanous Breathing and Patient connected to face mask oxygen  Post-op Assessment: Report given to RN and Post -op Vital signs reviewed and stable  Post vital signs: Reviewed and stable  Last Vitals:  Vitals Value Taken Time  BP 99/82 07/12/22 1633  Temp 35.8 1633  Pulse 78 07/12/22 1637  Resp 20   SpO2 100 % 07/12/22 1637  Vitals shown include unvalidated device data.  Last Pain:  Vitals:   07/12/22 1432  TempSrc: Tympanic  PainSc: 4          Complications: No notable events documented.

## 2022-07-13 MED ORDER — OXYCODONE HCL 5 MG PO TABS: 5.0000 mg | ORAL_TABLET | Freq: Four times a day (QID) | ORAL | 0 refills | Status: DC | PRN

## 2022-07-13 MED ORDER — IBUPROFEN 600 MG PO TABS: 600.0000 mg | ORAL_TABLET | Freq: Four times a day (QID) | ORAL | 0 refills | Status: AC | PRN

## 2022-07-13 NOTE — Discharge Summary (Signed)
[facility] SURGICAL DISCHARGE SUMMARY  Patient ID: TRU ROVER MRN: [id number] DOB/AGE: 29-Jul-1950 72 y.o.  Admit date: 07/12/2022 Discharge date: 07/13/2022  Discharge Diagnoses Patient Active Problem List   Diagnosis Date Noted   Symptomatic cholelithiasis 07/12/2022    Consultants None  Procedures 07/12/2022 Robotic assisted laparoscopic cholecystectomy  HPI: 72 y.o. male presented to [facility] ED overnight for abdominal pain. Patient reports the acute onset of epigastric discomfort last night around 0100 which awoke him from sleep. He reports feeling normal before going to sleep. This has been a sharp and crampy pain. Accompanied with nausea and an episode of emesis. No fever, chills, cough, SOB, CP, urinary changes, or bowel changes. No history of similar pain in the past. Previous surgeries positive for appendectomy. Work up in the ED revealed a normal WBC at 7.3, renal function baseline with sCr - 1.38, mild hyperbilirubinemia to 1.4 (was 1.3 previous), no electrolyte derangements, lipase normal at 38. Imaging revealed cholelithiasis. Attempts at pain control were unsuccessful in ED.   Hospital Course: Informed consent was obtained and documented, and patient underwent uneventful robotic assisted laparoscopic cholecystectomy (Dr Claudine Mouton, 07/12/2022).  Post-operatively, patient's pain/symptoms improved/resolved and advancement of patient's diet and ambulation were well-tolerated. The remainder of patient's hospital course was essentially unremarkable, and discharge planning was initiated accordingly with patient safely able to be discharged home with appropriate discharge instructions, pain control, and outpatient follow-up after all of his questions were answered to his expressed satisfaction.   Discharge Condition: Good   Physical Examination:  Constitutional: Well appearing male, NAD Pulmonary: Normal effort, no respiratory distress Gastrointestinal: Soft,  non-tender, non-distended, no rebound/guarding  Skin: Laparoscopic incisions are CDI with dermabond    Allergies as of 07/13/2022       Reactions   Niacin And Related    Penicillins    GI upset        Medication List     TAKE these medications    amLODipine 5 MG tablet Commonly known as: NORVASC Take 5 mg by mouth daily.   ascorbic acid 500 MG tablet Commonly known as: VITAMIN C Take 500 mg by mouth daily.   aspirin 81 MG chewable tablet Chew by mouth daily.   ibuprofen 600 MG tablet Commonly known as: ADVIL Take 1 tablet (600 mg total) by mouth every 6 (six) hours as needed.   levothyroxine 75 MCG tablet Commonly known as: SYNTHROID Take 75 mcg by mouth daily before breakfast.   Methylcobalamin 1000 MCG Subl Place 3,000 mcg under the tongue once a week. On Sundays   multivitamin capsule Take 1 capsule by mouth daily.   olmesartan 20 MG tablet Commonly known as: BENICAR Take 20 mg by mouth daily.   oxyCODONE 5 MG immediate release tablet Commonly known as: Oxy IR/ROXICODONE Take 1 tablet (5 mg total) by mouth every 6 (six) hours as needed for severe pain or breakthrough pain.   oxyCODONE-acetaminophen 5-325 MG tablet Commonly known as: Percocet Take 1-2 tablets by mouth every 6 (six) hours as needed for severe pain. Max 6 tabs per day   Vitamin B 12 500 MCG Tabs Take 1,000 mcg by mouth.          Follow-up Information     Donovan Kail, PA-C. Schedule an appointment as soon as possible for a visit in 3 week(s).   Specialty: Physician Assistant Why: s/p laparoscopic cholecystectomy Contact information: [address] [place] [address] [phone]  Time spent on discharge management including discussion of hospital course, clinical condition, outpatient instructions, prescriptions, and follow up with the patient and members of the medical team: >30 minutes  -- Lynden Oxford , PA-C [facility]  07/13/2022, 8:19 AM [phone] M-F: 7am - 4pm

## 2022-07-13 NOTE — Progress Notes (Signed)
Discharge instructions reviewed with patient including followup visits and new medications.  Understanding was verbalized and all questions were answered.  IV removed without complication; patient tolerated well.  Patient discharged home via wheelchair in stable condition escorted by volunteer staff.  

## 2022-07-14 NOTE — Anesthesia Postprocedure Evaluation (Signed)
Anesthesia Post Note  Patient: Don Holder  Procedure(s) Performed: XI ROBOTIC ASSISTED LAPAROSCOPIC CHOLECYSTECTOMY INDOCYANINE GREEN FLUORESCENCE IMAGING (ICG)  Patient location during evaluation: PACU Anesthesia Type: General Level of consciousness: awake and alert Pain management: pain level controlled Vital Signs Assessment: post-procedure vital signs reviewed and stable Respiratory status: spontaneous breathing, nonlabored ventilation, respiratory function stable and patient connected to nasal cannula oxygen Cardiovascular status: blood pressure returned to baseline and stable Postop Assessment: no apparent nausea or vomiting Anesthetic complications: no   No notable events documented.   Last Vitals:  Vitals:   07/13/22 0517 07/13/22 0720  BP: 101/67 117/74  Pulse: (!) 59 64  Resp: 18 18  Temp: 36.7 C 36.6 C  SpO2: 94% 96%    Last Pain:  Vitals:   07/12/22 2330  TempSrc:   PainSc: Asleep                 Stephanie Coup

## 2022-07-19 LAB — SURGICAL PATHOLOGY

## 2022-07-22 HISTORY — PX: CHOLECYSTECTOMY: SHX55

## 2022-08-01 ENCOUNTER — Ambulatory Visit (INDEPENDENT_AMBULATORY_CARE_PROVIDER_SITE_OTHER): Payer: PPO | Admitting: Physician Assistant

## 2022-08-01 ENCOUNTER — Encounter: Payer: Self-pay | Admitting: Physician Assistant

## 2022-08-01 VITALS — BP 146/90 | HR 71 | Temp 98.0°F | Ht 68.0 in | Wt 203.0 lb

## 2022-08-01 DIAGNOSIS — K8020 Calculus of gallbladder without cholecystitis without obstruction: Secondary | ICD-10-CM

## 2022-08-01 DIAGNOSIS — K8000 Calculus of gallbladder with acute cholecystitis without obstruction: Secondary | ICD-10-CM

## 2022-08-01 DIAGNOSIS — Z09 Encounter for follow-up examination after completed treatment for conditions other than malignant neoplasm: Secondary | ICD-10-CM

## 2022-08-01 NOTE — Patient Instructions (Signed)

## 2022-08-01 NOTE — Progress Notes (Signed)
[facility] POST-OP OFFICE VISIT  08/01/2022  HPI: Don Holder is a 72 y.o. male 20 days s/p robotic assisted laparoscopic cholecystectomy for acute cholecystitis with Dr Claudine Mouton  He has done very well Minor intermittent soreness at 12 mm port site No fever, chills, nausea, emesis, or bowel changes Tolerating PO without issue Incisions are well healed No other complaints   Vital signs: BP (!) 146/90   Pulse 71   Temp 98 F (36.7 C)   Ht 5\' 8"  (1.727 m)   Wt 203 lb (92.1 kg)   SpO2 97%   BMI 30.87 kg/m    Physical Exam: Constitutional: Well appearing male, NAD Abdomen: Soft, non-tender, non-distended, no rebound/guarding Skin: Laparoscopic incisions are healing well, no erythema or drainage   Assessment/Plan: This is a 72 y.o. male 20 days s/p robotic assisted laparoscopic cholecystectomy for acute cholecystitis with Dr Claudine Mouton   - Pain control prn  - Reviewed wound care recommendation  - Reviewed lifting restrictions; 4 weeks total  - Reviewed surgical pathology; acute on chronic cholecystitis  - He can follow up on as needed basis; He understands to call with questions/concerns  -- Lynden Oxford, PA-C [facility] 08/01/2022, 2:11 PM M-F: 7am - 4pm

## 2022-10-24 DIAGNOSIS — H0288A Meibomian gland dysfunction right eye, upper and lower eyelids: Secondary | ICD-10-CM | POA: Diagnosis not present

## 2022-10-24 DIAGNOSIS — H0288B Meibomian gland dysfunction left eye, upper and lower eyelids: Secondary | ICD-10-CM | POA: Diagnosis not present

## 2022-10-24 DIAGNOSIS — L718 Other rosacea: Secondary | ICD-10-CM | POA: Diagnosis not present

## 2022-10-24 DIAGNOSIS — H16223 Keratoconjunctivitis sicca, not specified as Sjogren's, bilateral: Secondary | ICD-10-CM | POA: Diagnosis not present

## 2022-11-23 DIAGNOSIS — L821 Other seborrheic keratosis: Secondary | ICD-10-CM | POA: Diagnosis not present

## 2022-11-23 DIAGNOSIS — D044 Carcinoma in situ of skin of scalp and neck: Secondary | ICD-10-CM | POA: Diagnosis not present

## 2022-11-23 DIAGNOSIS — D2272 Melanocytic nevi of left lower limb, including hip: Secondary | ICD-10-CM | POA: Diagnosis not present

## 2022-11-23 DIAGNOSIS — D2262 Melanocytic nevi of left upper limb, including shoulder: Secondary | ICD-10-CM | POA: Diagnosis not present

## 2022-11-23 DIAGNOSIS — C44719 Basal cell carcinoma of skin of left lower limb, including hip: Secondary | ICD-10-CM | POA: Diagnosis not present

## 2022-11-23 DIAGNOSIS — C44629 Squamous cell carcinoma of skin of left upper limb, including shoulder: Secondary | ICD-10-CM | POA: Diagnosis not present

## 2022-11-23 DIAGNOSIS — Z85828 Personal history of other malignant neoplasm of skin: Secondary | ICD-10-CM | POA: Diagnosis not present

## 2022-11-23 DIAGNOSIS — D225 Melanocytic nevi of trunk: Secondary | ICD-10-CM | POA: Diagnosis not present

## 2022-11-23 DIAGNOSIS — L570 Actinic keratosis: Secondary | ICD-10-CM | POA: Diagnosis not present

## 2022-11-23 DIAGNOSIS — C44519 Basal cell carcinoma of skin of other part of trunk: Secondary | ICD-10-CM | POA: Diagnosis not present

## 2022-11-23 DIAGNOSIS — D2271 Melanocytic nevi of right lower limb, including hip: Secondary | ICD-10-CM | POA: Diagnosis not present

## 2022-11-23 DIAGNOSIS — D2261 Melanocytic nevi of right upper limb, including shoulder: Secondary | ICD-10-CM | POA: Diagnosis not present

## 2022-11-23 DIAGNOSIS — D485 Neoplasm of uncertain behavior of skin: Secondary | ICD-10-CM | POA: Diagnosis not present

## 2022-12-04 DIAGNOSIS — Z23 Encounter for immunization: Secondary | ICD-10-CM | POA: Diagnosis not present

## 2022-12-21 DIAGNOSIS — C44629 Squamous cell carcinoma of skin of left upper limb, including shoulder: Secondary | ICD-10-CM | POA: Diagnosis not present

## 2023-01-04 DIAGNOSIS — D044 Carcinoma in situ of skin of scalp and neck: Secondary | ICD-10-CM | POA: Diagnosis not present

## 2023-01-16 DIAGNOSIS — C44519 Basal cell carcinoma of skin of other part of trunk: Secondary | ICD-10-CM | POA: Diagnosis not present

## 2023-01-16 DIAGNOSIS — C44719 Basal cell carcinoma of skin of left lower limb, including hip: Secondary | ICD-10-CM | POA: Diagnosis not present

## 2023-02-22 DIAGNOSIS — H16223 Keratoconjunctivitis sicca, not specified as Sjogren's, bilateral: Secondary | ICD-10-CM | POA: Diagnosis not present

## 2023-02-22 DIAGNOSIS — H524 Presbyopia: Secondary | ICD-10-CM | POA: Diagnosis not present

## 2023-02-22 DIAGNOSIS — L718 Other rosacea: Secondary | ICD-10-CM | POA: Diagnosis not present

## 2023-02-22 DIAGNOSIS — H0288A Meibomian gland dysfunction right eye, upper and lower eyelids: Secondary | ICD-10-CM | POA: Diagnosis not present

## 2023-02-22 DIAGNOSIS — H43813 Vitreous degeneration, bilateral: Secondary | ICD-10-CM | POA: Diagnosis not present

## 2023-02-22 DIAGNOSIS — H5203 Hypermetropia, bilateral: Secondary | ICD-10-CM | POA: Diagnosis not present

## 2023-02-22 DIAGNOSIS — H52223 Regular astigmatism, bilateral: Secondary | ICD-10-CM | POA: Diagnosis not present

## 2023-02-22 DIAGNOSIS — H0288B Meibomian gland dysfunction left eye, upper and lower eyelids: Secondary | ICD-10-CM | POA: Diagnosis not present

## 2023-04-17 DIAGNOSIS — M7912 Myalgia of auxiliary muscles, head and neck: Secondary | ICD-10-CM | POA: Diagnosis not present

## 2023-04-17 DIAGNOSIS — M5010 Cervical disc disorder with radiculopathy, unspecified cervical region: Secondary | ICD-10-CM | POA: Diagnosis not present

## 2023-04-17 DIAGNOSIS — K2990 Gastroduodenitis, unspecified, without bleeding: Secondary | ICD-10-CM | POA: Diagnosis not present

## 2023-04-17 DIAGNOSIS — I2089 Other forms of angina pectoris: Secondary | ICD-10-CM | POA: Diagnosis not present

## 2023-05-14 DIAGNOSIS — I200 Unstable angina: Secondary | ICD-10-CM | POA: Diagnosis not present

## 2023-05-14 DIAGNOSIS — R0789 Other chest pain: Secondary | ICD-10-CM | POA: Diagnosis not present

## 2023-05-14 DIAGNOSIS — R9439 Abnormal result of other cardiovascular function study: Secondary | ICD-10-CM | POA: Diagnosis not present

## 2023-05-14 DIAGNOSIS — I2089 Other forms of angina pectoris: Secondary | ICD-10-CM | POA: Diagnosis not present

## 2023-05-14 DIAGNOSIS — E6609 Other obesity due to excess calories: Secondary | ICD-10-CM | POA: Diagnosis not present

## 2023-05-14 DIAGNOSIS — I10 Essential (primary) hypertension: Secondary | ICD-10-CM | POA: Diagnosis not present

## 2023-05-14 DIAGNOSIS — I209 Angina pectoris, unspecified: Secondary | ICD-10-CM | POA: Diagnosis not present

## 2023-05-14 DIAGNOSIS — E66811 Obesity, class 1: Secondary | ICD-10-CM | POA: Diagnosis not present

## 2023-05-14 DIAGNOSIS — E782 Mixed hyperlipidemia: Secondary | ICD-10-CM | POA: Diagnosis not present

## 2023-05-14 DIAGNOSIS — Z8249 Family history of ischemic heart disease and other diseases of the circulatory system: Secondary | ICD-10-CM | POA: Diagnosis not present

## 2023-05-14 DIAGNOSIS — Z6830 Body mass index (BMI) 30.0-30.9, adult: Secondary | ICD-10-CM | POA: Diagnosis not present

## 2023-05-21 DIAGNOSIS — R0789 Other chest pain: Secondary | ICD-10-CM | POA: Diagnosis not present

## 2023-05-22 DIAGNOSIS — E782 Mixed hyperlipidemia: Secondary | ICD-10-CM | POA: Diagnosis not present

## 2023-05-22 DIAGNOSIS — L270 Generalized skin eruption due to drugs and medicaments taken internally: Secondary | ICD-10-CM | POA: Diagnosis not present

## 2023-05-22 DIAGNOSIS — E538 Deficiency of other specified B group vitamins: Secondary | ICD-10-CM | POA: Diagnosis not present

## 2023-05-22 DIAGNOSIS — Z125 Encounter for screening for malignant neoplasm of prostate: Secondary | ICD-10-CM | POA: Diagnosis not present

## 2023-05-22 DIAGNOSIS — I200 Unstable angina: Secondary | ICD-10-CM | POA: Diagnosis not present

## 2023-05-22 DIAGNOSIS — R739 Hyperglycemia, unspecified: Secondary | ICD-10-CM | POA: Diagnosis not present

## 2023-05-25 ENCOUNTER — Encounter: Payer: Self-pay | Admitting: Internal Medicine

## 2023-05-25 ENCOUNTER — Encounter
Admission: RE | Disposition: A | Discharge status: Home / Self Care | Payer: Self-pay | Source: Home / Self Care | Attending: Internal Medicine

## 2023-05-25 ENCOUNTER — Ambulatory Visit
Admission: RE | Admit: 2023-05-25 | Discharge: 2023-05-25 | Disposition: A | Discharge status: Home / Self Care | Attending: Internal Medicine | Admitting: Internal Medicine

## 2023-05-25 ENCOUNTER — Other Ambulatory Visit: Payer: Self-pay

## 2023-05-25 DIAGNOSIS — I2510 Atherosclerotic heart disease of native coronary artery without angina pectoris: Secondary | ICD-10-CM | POA: Insufficient documentation

## 2023-05-25 DIAGNOSIS — R9439 Abnormal result of other cardiovascular function study: Secondary | ICD-10-CM

## 2023-05-25 DIAGNOSIS — Z7982 Long term (current) use of aspirin: Secondary | ICD-10-CM | POA: Diagnosis not present

## 2023-05-25 HISTORY — PX: LEFT HEART CATH AND CORONARY ANGIOGRAPHY: CATH118249

## 2023-05-25 SURGERY — LEFT HEART CATH AND CORONARY ANGIOGRAPHY
Anesthesia: Moderate Sedation | Laterality: Left

## 2023-05-25 MED ORDER — SODIUM CHLORIDE 0.9% FLUSH: 3.0000 mL | Freq: Two times a day (BID) | INTRAVENOUS | Status: DC

## 2023-05-25 MED ORDER — VERAPAMIL HCL 2.5 MG/ML IV SOLN
INTRAVENOUS | Status: DC | PRN
  Administered 2023-05-25: 2.5 mg via INTRA_ARTERIAL

## 2023-05-25 MED ORDER — LABETALOL HCL 5 MG/ML IV SOLN: 10.0000 mg | INTRAVENOUS | Status: DC | PRN

## 2023-05-25 MED ORDER — MIDAZOLAM HCL 2 MG/2ML IJ SOLN
INTRAMUSCULAR | Status: DC | PRN
  Administered 2023-05-25: 1 mg via INTRAVENOUS

## 2023-05-25 MED ORDER — ASPIRIN 81 MG PO CHEW
81.0000 mg | CHEWABLE_TABLET | ORAL | Status: AC
  Administered 2023-05-25: 81 mg via ORAL

## 2023-05-25 MED ORDER — LIDOCAINE HCL 1 % IJ SOLN
INTRAMUSCULAR | Status: AC
  Filled 2023-05-25: qty 20

## 2023-05-25 MED ORDER — LIDOCAINE HCL (PF) 1 % IJ SOLN
INTRAMUSCULAR | Status: DC | PRN
  Administered 2023-05-25: 10 mL
  Administered 2023-05-25: 2 mL

## 2023-05-25 MED ORDER — SODIUM CHLORIDE 0.9 % WEIGHT BASED INFUSION
3.0000 mL/kg/h | INTRAVENOUS | Status: AC
  Administered 2023-05-25: 3 mL/kg/h via INTRAVENOUS

## 2023-05-25 MED ORDER — HEPARIN SODIUM (PORCINE) 1000 UNIT/ML IJ SOLN
INTRAMUSCULAR | Status: AC
  Filled 2023-05-25: qty 10

## 2023-05-25 MED ORDER — SODIUM CHLORIDE 0.9 % IV SOLN: 250.0000 mL | INTRAVENOUS | Status: DC | PRN

## 2023-05-25 MED ORDER — SODIUM CHLORIDE 0.9 % WEIGHT BASED INFUSION: 1.0000 mL/kg/h | INTRAVENOUS | Status: DC

## 2023-05-25 MED ORDER — ASPIRIN 81 MG PO CHEW
CHEWABLE_TABLET | ORAL | Status: AC
  Filled 2023-05-25: qty 1

## 2023-05-25 MED ORDER — HEPARIN (PORCINE) IN NACL 1000-0.9 UT/500ML-% IV SOLN
INTRAVENOUS | Status: DC | PRN
  Administered 2023-05-25: 1000 mL

## 2023-05-25 MED ORDER — MIDAZOLAM HCL 2 MG/2ML IJ SOLN
INTRAMUSCULAR | Status: AC
  Filled 2023-05-25: qty 2

## 2023-05-25 MED ORDER — FENTANYL CITRATE (PF) 100 MCG/2ML IJ SOLN
INTRAMUSCULAR | Status: AC
  Filled 2023-05-25: qty 2

## 2023-05-25 MED ORDER — HEPARIN (PORCINE) IN NACL 1000-0.9 UT/500ML-% IV SOLN
INTRAVENOUS | Status: AC
  Filled 2023-05-25: qty 1000

## 2023-05-25 MED ORDER — VERAPAMIL HCL 2.5 MG/ML IV SOLN
INTRAVENOUS | Status: AC
  Filled 2023-05-25: qty 2

## 2023-05-25 MED ORDER — HYDRALAZINE HCL 20 MG/ML IJ SOLN: 10.0000 mg | INTRAMUSCULAR | Status: DC | PRN

## 2023-05-25 MED ORDER — IOHEXOL 300 MG/ML  SOLN
INTRAMUSCULAR | Status: DC | PRN
  Administered 2023-05-25: 70 mL

## 2023-05-25 MED ORDER — FENTANYL CITRATE (PF) 100 MCG/2ML IJ SOLN
INTRAMUSCULAR | Status: DC | PRN
  Administered 2023-05-25: 25 ug via INTRAVENOUS

## 2023-05-25 MED ORDER — SODIUM CHLORIDE 0.9% FLUSH: 3.0000 mL | INTRAVENOUS | Status: DC | PRN

## 2023-05-25 MED ORDER — ASPIRIN 81 MG PO CHEW: 81.0000 mg | CHEWABLE_TABLET | Freq: Every day | ORAL | Status: DC

## 2023-05-25 SURGICAL SUPPLY — 19 items
CATH 5FR JL3.5 JR4 ANG PIG MP (CATHETERS) IMPLANT
CATH INFINITI 5FR JL4 (CATHETERS) IMPLANT
DEVICE CLOSURE MYNXGRIP 5F (Vascular Products) IMPLANT
DEVICE RAD TR BAND REGULAR (VASCULAR PRODUCTS) IMPLANT
DRAPE BRACHIAL (DRAPES) IMPLANT
GLIDESHEATH SLEND SS 6F .021 (SHEATH) IMPLANT
GUIDEWIRE ANGLED .035 180CM (WIRE) IMPLANT
GUIDEWIRE INQWIRE 1.5J.035X260 (WIRE) IMPLANT
INQWIRE 1.5J .035X260CM (WIRE) ×1 IMPLANT
KIT SYRINGE INJ CVI SPIKEX1 (MISCELLANEOUS) IMPLANT
NDL PERC 18GX7CM (NEEDLE) IMPLANT
NEEDLE PERC 18GX7CM (NEEDLE) ×1 IMPLANT
PACK CARDIAC CATH (CUSTOM PROCEDURE TRAY) ×1 IMPLANT
PROTECTION STATION PRESSURIZED (MISCELLANEOUS) ×1 IMPLANT
SET ATX-X65L (MISCELLANEOUS) IMPLANT
SHEATH AVANTI 5FR X 11CM (SHEATH) IMPLANT
STATION PROTECTION PRESSURIZED (MISCELLANEOUS) IMPLANT
WIRE GUIDERIGHT .035X150 (WIRE) IMPLANT
WIRE HITORQ VERSACORE ST 145CM (WIRE) IMPLANT

## 2023-05-25 NOTE — Discharge Instructions (Signed)
 Radial Site Care Refer to this sheet in the next few weeks. These instructions provide you with information about caring for yourself after your procedure. Your health care provider may also give you more specific instructions. Your treatment has been planned according to current medical practices, but problems sometimes occur. Call your health care provider if you have any problems or questions after your procedure. What can I expect after the procedure? After your procedure, it is typical to have the following: Bruising at the radial site that usually fades within 1-2 weeks. Blood collecting in the tissue (hematoma) that may be painful to the touch. It should usually decrease in size and tenderness within 1-2 weeks.  Follow these instructions at home: Take medicines only as directed by your health care provider. If you are on a medication called Metformin please do not take for 48 hours after your procedure. Over the next 48hrs please increase your fluid intake of water and non caffeine beverages to flush the contrast dye out of your system.  You may shower 24 hours after the procedure  Leave your bandage on and gently wash the site with plain soap and water. Pat the area dry with a clean towel. Do not rub the site, because this may cause bleeding.  Remove your dressing 48hrs after your procedure and leave open to air.  Do not submerge your site in water for 7 days. This includes swimming and washing dishes.  Check your insertion site every day for redness, swelling, or drainage. Do not apply powder or lotion to the site. Do not flex or bend the affected arm for 24 hours or as directed by your health care provider. Do not push or pull heavy objects with the affected arm for 24 hours or as directed by your health care provider. Do not lift over 10 lb (4.5 kg) for 5 days after your procedure or as directed by your health care provider. Ask your health care provider when it is okay to: Return to  work or school. Resume usual physical activities or sports. Resume sexual activity. Do not drive home if you are discharged the same day as the procedure. Have someone else drive you. You may drive 48 hours after the procedure Do not operate machinery or power tools for 24 hours after the procedure. If your procedure was done as an outpatient procedure, which means that you went home the same day as your procedure, a responsible adult should be with you for the first 24 hours after you arrive home. Keep all follow-up visits as directed by your health care provider. This is important. Contact a health care provider if: You have a fever. You have chills. You have increased bleeding from the radial site. Hold pressure on the site. Get help right away if: You have unusual pain at the radial site. You have redness, warmth, or swelling at the radial site. You have drainage (other than a small amount of blood on the dressing) from the radial site. The radial site is bleeding, and the bleeding does not stop after 15 minutes of holding steady pressure on the site. Your arm or hand becomes pale, cool, tingly, or numb. This information is not intended to replace advice given to you by your health care provider. Make sure you discuss any questions you have with your health care provider. Document Released: 03/11/2010 Document Revised: 07/15/2015 Document Reviewed: 08/25/2013 Elsevier Interactive Patient Education  2018 [employer]      Femoral Site Care Refer to  this sheet in the next few weeks. These instructions provide you with information about caring for yourself after your procedure. Your health care provider may also give you more specific instructions. Your treatment has been planned according to current medical practices, but problems sometimes occur. Call your health care provider if you have any problems or questions after your procedure. What can I expect after the procedure? After  your procedure, it is typical to have the following: Bruising at the site that usually fades within 1-2 weeks. Blood collecting in the tissue (hematoma) that may be painful to the touch. It should usually decrease in size and tenderness within 1-2 weeks.  Follow these instructions at home:  Take medicines only as directed by your health care provider.  If you take Metformin, hold for 48hours after your procedure.  The x-ray dye causes you to pass a considerate amount of urine.  For this reason, you will be asked to drink plenty of liquids after the procedure to prevent dehydration.  You may resume you regular diet.  Avoid caffeine products.   You may shower 24 hours after procedure. Leave the bandage on your access site for.  You may wash around your dressing but do not rub the site, this may cause bleeding.  Pat the area dry with a clean towel. After 48hours remove bandage and leave open to air. Do not take baths, swim, or use a hot tub for 7 days. Check your insertion site every day for redness, swelling, or drainage. If you lose feeling or develop tingling or pain in your leg or foot after the procedure, please walk around first.  If the discomfort does not improve , contact your physician and proceed to the nearest emergency room.  Loss of feeling in your leg might mean that a blockage has formed in the artery and this can be appropriately treated.  Limit your activity for the next two days after your procedure.  Avoid stooping, bending, heavy lifting or exertion as this may put pressure on the insertion site.  Resume normal activities in 48 hours.   check the insertion site occasionally.  If any oozing occurs or there is apparent swelling, firm pressure over the site will prevent a bruise from forming.  You can not hurt anything by pressing directly on the site.  The pressure stops the bleeding by allowing a small clot to form.  If the bleeding continues after the pressure has been applied for more  than 15 minutes, call 911 or go to the nearest emergency room.    Apply pressure to access site if you have to laugh, cough, or sneeze. Do not apply powder or lotion to the site. Limit use of stairs to twice a day for the first 2-3 days or as directed by your health care provider. Do not squat for the first 2-3 days or as directed by your health care provider. Do not lift over 10 lb (4.5 kg) for 5 days after your procedure or as directed by your health care provider. Ask your health care provider when it is okay to: Return to work or school. Resume usual physical activities or sports. Resume sexual activity. Do not drive home if you are discharged the same day as the procedure. Have someone else drive you. You may drive 48 hours after the procedure unless otherwise instructed by your health care provider. Do not operate machinery or power tools for 24 hours after the procedure or as directed by your health care provider.  If your procedure was done as an outpatient procedure, which means that you went home the same day as your procedure, a responsible adult should be with you for the first 24 hours after you arrive home. Keep all follow-up visits as directed by your health care provider. This is important. Contact a health care provider if: You have a fever. You have chills. You have increased bleeding from the site. Hold pressure on the site. Get help right away if: You have unusual pain at the site. You have redness, warmth, or swelling at the site. You have drainage (other than a small amount of blood on the dressing) from the site. The site is bleeding, and the bleeding does not stop after 30 minutes of holding steady pressure on the site. Your leg or foot becomes pale, cool, tingly, or numb. This information is not intended to replace advice given to you by your health care provider. Make sure you discuss any questions you have with your health care provider. Document Released:  10/10/2013 Document Revised: 07/15/2015 Document Reviewed: 08/26/2013 Elsevier Interactive Patient Education  [employer].

## 2023-05-28 ENCOUNTER — Encounter: Payer: Self-pay | Admitting: Internal Medicine

## 2023-05-28 LAB — CARDIAC CATHETERIZATION: Cath EF Quantitative: 55 %

## 2023-05-29 ENCOUNTER — Ambulatory Visit
Admission: RE | Admit: 2023-05-29 | Discharge: 2023-05-29 | Disposition: A | Discharge status: Home / Self Care | Source: Ambulatory Visit | Attending: Internal Medicine | Admitting: Internal Medicine

## 2023-05-29 ENCOUNTER — Other Ambulatory Visit: Payer: Self-pay | Admitting: Internal Medicine

## 2023-05-29 DIAGNOSIS — R1084 Generalized abdominal pain: Secondary | ICD-10-CM | POA: Insufficient documentation

## 2023-05-29 DIAGNOSIS — I200 Unstable angina: Secondary | ICD-10-CM | POA: Insufficient documentation

## 2023-05-29 DIAGNOSIS — M7918 Myalgia, other site: Secondary | ICD-10-CM | POA: Diagnosis not present

## 2023-05-29 DIAGNOSIS — M5116 Intervertebral disc disorders with radiculopathy, lumbar region: Secondary | ICD-10-CM | POA: Diagnosis not present

## 2023-05-29 DIAGNOSIS — D735 Infarction of spleen: Secondary | ICD-10-CM | POA: Diagnosis not present

## 2023-05-29 DIAGNOSIS — E782 Mixed hyperlipidemia: Secondary | ICD-10-CM | POA: Diagnosis not present

## 2023-05-29 DIAGNOSIS — I714 Abdominal aortic aneurysm, without rupture, unspecified: Secondary | ICD-10-CM | POA: Diagnosis not present

## 2023-05-29 DIAGNOSIS — K7689 Other specified diseases of liver: Secondary | ICD-10-CM | POA: Diagnosis not present

## 2023-05-29 DIAGNOSIS — N281 Cyst of kidney, acquired: Secondary | ICD-10-CM | POA: Diagnosis not present

## 2023-05-29 DIAGNOSIS — K5730 Diverticulosis of large intestine without perforation or abscess without bleeding: Secondary | ICD-10-CM | POA: Diagnosis not present

## 2023-05-29 MED ORDER — IOHEXOL 300 MG/ML  SOLN
100.0000 mL | Freq: Once | INTRAMUSCULAR | Status: AC | PRN
  Administered 2023-05-29: 100 mL via INTRAVENOUS

## 2023-05-30 ENCOUNTER — Inpatient Hospital Stay
Admission: RE | Admit: 2023-05-30 | Discharge: 2023-05-30 | Disposition: A | Discharge status: Home / Self Care | Source: Home / Self Care | Attending: Student | Admitting: Student

## 2023-05-30 ENCOUNTER — Inpatient Hospital Stay
Admission: AD | Admit: 2023-05-30 | Discharge: 2023-06-01 | DRG: 322 | Disposition: A | Discharge status: Home / Self Care | Attending: Cardiology | Admitting: Cardiology

## 2023-05-30 ENCOUNTER — Other Ambulatory Visit: Payer: Self-pay

## 2023-05-30 ENCOUNTER — Encounter: Payer: Self-pay | Admitting: Internal Medicine

## 2023-05-30 ENCOUNTER — Encounter
Admission: AD | Disposition: A | Discharge status: Home / Self Care | Payer: Self-pay | Source: Home / Self Care | Attending: Internal Medicine

## 2023-05-30 DIAGNOSIS — R9439 Abnormal result of other cardiovascular function study: Secondary | ICD-10-CM

## 2023-05-30 DIAGNOSIS — I25119 Atherosclerotic heart disease of native coronary artery with unspecified angina pectoris: Secondary | ICD-10-CM | POA: Diagnosis present

## 2023-05-30 DIAGNOSIS — Z79899 Other long term (current) drug therapy: Secondary | ICD-10-CM | POA: Diagnosis not present

## 2023-05-30 DIAGNOSIS — Z9861 Coronary angioplasty status: Secondary | ICD-10-CM | POA: Diagnosis not present

## 2023-05-30 DIAGNOSIS — E039 Hypothyroidism, unspecified: Secondary | ICD-10-CM | POA: Diagnosis present

## 2023-05-30 DIAGNOSIS — I959 Hypotension, unspecified: Secondary | ICD-10-CM | POA: Diagnosis not present

## 2023-05-30 DIAGNOSIS — Z79890 Hormone replacement therapy (postmenopausal): Secondary | ICD-10-CM | POA: Diagnosis not present

## 2023-05-30 DIAGNOSIS — Z950 Presence of cardiac pacemaker: Secondary | ICD-10-CM | POA: Diagnosis not present

## 2023-05-30 DIAGNOSIS — Z7902 Long term (current) use of antithrombotics/antiplatelets: Secondary | ICD-10-CM

## 2023-05-30 DIAGNOSIS — E785 Hyperlipidemia, unspecified: Secondary | ICD-10-CM | POA: Diagnosis present

## 2023-05-30 DIAGNOSIS — I10 Essential (primary) hypertension: Secondary | ICD-10-CM | POA: Diagnosis not present

## 2023-05-30 DIAGNOSIS — Z85828 Personal history of other malignant neoplasm of skin: Secondary | ICD-10-CM | POA: Diagnosis not present

## 2023-05-30 DIAGNOSIS — I97190 Other postprocedural cardiac functional disturbances following cardiac surgery: Secondary | ICD-10-CM | POA: Diagnosis not present

## 2023-05-30 DIAGNOSIS — Z955 Presence of coronary angioplasty implant and graft: Principal | ICD-10-CM

## 2023-05-30 DIAGNOSIS — I219 Acute myocardial infarction, unspecified: Secondary | ICD-10-CM | POA: Diagnosis not present

## 2023-05-30 DIAGNOSIS — I2510 Atherosclerotic heart disease of native coronary artery without angina pectoris: Secondary | ICD-10-CM | POA: Diagnosis present

## 2023-05-30 DIAGNOSIS — I952 Hypotension due to drugs: Secondary | ICD-10-CM | POA: Diagnosis not present

## 2023-05-30 DIAGNOSIS — Z7982 Long term (current) use of aspirin: Secondary | ICD-10-CM

## 2023-05-30 HISTORY — PX: CORONARY STENT INTERVENTION: CATH118234

## 2023-05-30 LAB — CBC
HCT: 44.8 % (ref 39.0–52.0)
Hemoglobin: 15.1 g/dL (ref 13.0–17.0)
MCH: 29.2 pg (ref 26.0–34.0)
MCHC: 33.7 g/dL (ref 30.0–36.0)
MCV: 86.5 fL (ref 80.0–100.0)
Platelets: 221 10*3/uL (ref 150–400)
RBC: 5.18 MIL/uL (ref 4.22–5.81)
RDW: 14.1 % (ref 11.5–15.5)
WBC: 11.4 10*3/uL — ABNORMAL HIGH (ref 4.0–10.5)
nRBC: 0 % (ref 0.0–0.2)

## 2023-05-30 LAB — MRSA NEXT GEN BY PCR, NASAL: MRSA by PCR Next Gen: NOT DETECTED

## 2023-05-30 LAB — PROTIME-INR
INR: 1.3 — ABNORMAL HIGH (ref 0.8–1.2)
Prothrombin Time: 16.3 s — ABNORMAL HIGH (ref 11.4–15.2)

## 2023-05-30 LAB — APTT: aPTT: 62 s — ABNORMAL HIGH (ref 24–36)

## 2023-05-30 LAB — POCT ACTIVATED CLOTTING TIME: Activated Clotting Time: 354 s

## 2023-05-30 SURGERY — CORONARY STENT INTERVENTION
Anesthesia: Moderate Sedation

## 2023-05-30 MED ORDER — SODIUM CHLORIDE 0.9 % IV SOLN
INTRAVENOUS | Status: DC | PRN
  Administered 2023-05-30 (×2): 1.75 mg/kg/h via INTRAVENOUS

## 2023-05-30 MED ORDER — PERFLUTREN LIPID MICROSPHERE
1.0000 mL | INTRAVENOUS | Status: DC | PRN
  Administered 2023-05-30: 2 mL via INTRAVENOUS

## 2023-05-30 MED ORDER — MIDAZOLAM HCL 2 MG/2ML IJ SOLN
INTRAMUSCULAR | Status: AC
  Filled 2023-05-30: qty 2

## 2023-05-30 MED ORDER — HYDRALAZINE HCL 20 MG/ML IJ SOLN: 10.0000 mg | INTRAMUSCULAR | Status: AC | PRN

## 2023-05-30 MED ORDER — HEPARIN (PORCINE) IN NACL 1000-0.9 UT/500ML-% IV SOLN
INTRAVENOUS | Status: DC | PRN
Start: 2023-05-30 — End: 2023-05-30
  Administered 2023-05-30: 1000 mL

## 2023-05-30 MED ORDER — HEPARIN (PORCINE) 25000 UT/250ML-% IV SOLN
INTRAVENOUS | Status: AC
  Filled 2023-05-30: qty 250

## 2023-05-30 MED ORDER — PRASUGREL HCL 10 MG PO TABS
10.0000 mg | ORAL_TABLET | Freq: Every day | ORAL | Status: DC
  Administered 2023-05-31 – 2023-06-01 (×2): 10 mg via ORAL
  Filled 2023-05-30 (×2): qty 1

## 2023-05-30 MED ORDER — CHLORHEXIDINE GLUCONATE CLOTH 2 % EX PADS
6.0000 | MEDICATED_PAD | Freq: Every day | CUTANEOUS | Status: DC
  Administered 2023-05-31 – 2023-06-01 (×2): 6 via TOPICAL

## 2023-05-30 MED ORDER — NITROGLYCERIN IN D5W 200-5 MCG/ML-% IV SOLN
0.0000 ug/min | INTRAVENOUS | Status: DC
  Administered 2023-05-30: 10 ug/min via INTRAVENOUS

## 2023-05-30 MED ORDER — ACETAMINOPHEN 325 MG PO TABS
650.0000 mg | ORAL_TABLET | ORAL | Status: DC | PRN
  Administered 2023-05-31 (×2): 650 mg via ORAL
  Filled 2023-05-30 (×2): qty 2

## 2023-05-30 MED ORDER — FENTANYL CITRATE (PF) 100 MCG/2ML IJ SOLN
INTRAMUSCULAR | Status: AC
  Filled 2023-05-30: qty 2

## 2023-05-30 MED ORDER — BIVALIRUDIN TRIFLUOROACETATE 250 MG IV SOLR
INTRAVENOUS | Status: AC
  Filled 2023-05-30: qty 250

## 2023-05-30 MED ORDER — LEVOTHYROXINE SODIUM 50 MCG PO TABS
75.0000 ug | ORAL_TABLET | Freq: Every day | ORAL | Status: DC
  Administered 2023-05-31 – 2023-06-01 (×2): 75 ug via ORAL
  Filled 2023-05-30 (×2): qty 2
  Filled 2023-05-30: qty 1

## 2023-05-30 MED ORDER — SODIUM CHLORIDE 0.9 % WEIGHT BASED INFUSION
3.0000 mL/kg/h | INTRAVENOUS | Status: AC
  Administered 2023-05-30: 3 mL/kg/h via INTRAVENOUS

## 2023-05-30 MED ORDER — ORAL CARE MOUTH RINSE: 15.0000 mL | OROMUCOSAL | Status: DC | PRN

## 2023-05-30 MED ORDER — NITROGLYCERIN IN D5W 200-5 MCG/ML-% IV SOLN
INTRAVENOUS | Status: AC
  Filled 2023-05-30: qty 250

## 2023-05-30 MED ORDER — ONDANSETRON HCL 4 MG/2ML IJ SOLN
INTRAMUSCULAR | Status: AC
  Filled 2023-05-30: qty 2

## 2023-05-30 MED ORDER — HEPARIN (PORCINE) 25000 UT/250ML-% IV SOLN
1150.0000 [IU]/h | INTRAVENOUS | Status: DC
  Administered 2023-05-30: 1000 [IU]/h via INTRAVENOUS
  Administered 2023-05-31: 1150 [IU]/h via INTRAVENOUS
  Filled 2023-05-30: qty 250

## 2023-05-30 MED ORDER — PRASUGREL HCL 10 MG PO TABS
ORAL_TABLET | ORAL | Status: AC
  Filled 2023-05-30: qty 6

## 2023-05-30 MED ORDER — SODIUM CHLORIDE 0.9 % WEIGHT BASED INFUSION: 1.0000 mL/kg/h | INTRAVENOUS | Status: DC

## 2023-05-30 MED ORDER — MORPHINE SULFATE (PF) 2 MG/ML IV SOLN
INTRAVENOUS | Status: AC
Start: 2023-05-30 — End: ?
  Filled 2023-05-30: qty 1

## 2023-05-30 MED ORDER — NITROGLYCERIN 1 MG/10 ML FOR IR/CATH LAB
INTRA_ARTERIAL | Status: AC
Start: 2023-05-30 — End: ?
  Filled 2023-05-30: qty 10

## 2023-05-30 MED ORDER — LABETALOL HCL 5 MG/ML IV SOLN: 10.0000 mg | INTRAVENOUS | Status: AC | PRN

## 2023-05-30 MED ORDER — LIDOCAINE HCL (PF) 1 % IJ SOLN
INTRAMUSCULAR | Status: DC | PRN
  Administered 2023-05-30: 10 mL

## 2023-05-30 MED ORDER — LIDOCAINE HCL 1 % IJ SOLN
INTRAMUSCULAR | Status: AC
  Filled 2023-05-30: qty 20

## 2023-05-30 MED ORDER — SODIUM CHLORIDE 0.9% FLUSH
3.0000 mL | Freq: Two times a day (BID) | INTRAVENOUS | Status: DC
  Administered 2023-05-31 (×3): 3 mL via INTRAVENOUS

## 2023-05-30 MED ORDER — MIDAZOLAM HCL 2 MG/2ML IJ SOLN
INTRAMUSCULAR | Status: AC
Start: 2023-05-30 — End: ?
  Filled 2023-05-30: qty 2

## 2023-05-30 MED ORDER — ASPIRIN 81 MG PO CHEW
81.0000 mg | CHEWABLE_TABLET | Freq: Every day | ORAL | Status: DC
  Administered 2023-05-31 – 2023-06-01 (×2): 81 mg via ORAL
  Filled 2023-05-30 (×2): qty 1

## 2023-05-30 MED ORDER — PANTOPRAZOLE SODIUM 40 MG PO TBEC
40.0000 mg | DELAYED_RELEASE_TABLET | Freq: Every day | ORAL | Status: DC
  Administered 2023-05-31 – 2023-06-01 (×2): 40 mg via ORAL
  Filled 2023-05-30 (×2): qty 1

## 2023-05-30 MED ORDER — ONDANSETRON HCL 4 MG/2ML IJ SOLN
4.0000 mg | Freq: Four times a day (QID) | INTRAMUSCULAR | Status: DC | PRN
  Administered 2023-05-30: 4 mg via INTRAVENOUS
  Filled 2023-05-30: qty 2

## 2023-05-30 MED ORDER — SODIUM CHLORIDE 0.9 % WEIGHT BASED INFUSION
1.0000 mL/kg/h | INTRAVENOUS | Status: AC
  Administered 2023-05-30: 1 mL/kg/h via INTRAVENOUS

## 2023-05-30 MED ORDER — FENTANYL CITRATE (PF) 100 MCG/2ML IJ SOLN
INTRAMUSCULAR | Status: DC | PRN
  Administered 2023-05-30 (×4): 25 ug via INTRAVENOUS

## 2023-05-30 MED ORDER — MIDAZOLAM HCL 2 MG/2ML IJ SOLN
INTRAMUSCULAR | Status: DC | PRN
  Administered 2023-05-30 (×3): 1 mg via INTRAVENOUS

## 2023-05-30 MED ORDER — BIVALIRUDIN BOLUS VIA INFUSION - CUPID
INTRAVENOUS | Status: DC | PRN
  Administered 2023-05-30: 66 mg via INTRAVENOUS

## 2023-05-30 MED ORDER — SODIUM CHLORIDE 0.9 % IV SOLN: 250.0000 mL | INTRAVENOUS | Status: DC | PRN

## 2023-05-30 MED ORDER — PRASUGREL HCL 10 MG PO TABS
ORAL_TABLET | ORAL | Status: DC | PRN
Start: 2023-05-30 — End: 2023-05-30
  Administered 2023-05-30: 60 mg via ORAL

## 2023-05-30 MED ORDER — ASPIRIN 81 MG PO CHEW
81.0000 mg | CHEWABLE_TABLET | ORAL | Status: AC
  Administered 2023-05-30: 81 mg via ORAL

## 2023-05-30 MED ORDER — SODIUM CHLORIDE 0.9% FLUSH: 3.0000 mL | INTRAVENOUS | Status: DC | PRN

## 2023-05-30 MED ORDER — PRASUGREL HCL 10 MG PO TABS
ORAL_TABLET | ORAL | Status: AC
Start: 2023-05-30 — End: ?
  Filled 2023-05-30: qty 2

## 2023-05-30 MED ORDER — ATROPINE SULFATE 1 MG/10ML IJ SOSY
PREFILLED_SYRINGE | INTRAMUSCULAR | Status: AC
  Filled 2023-05-30: qty 10

## 2023-05-30 MED ORDER — MORPHINE SULFATE (PF) 2 MG/ML IV SOLN
2.0000 mg | Freq: Once | INTRAVENOUS | Status: AC
  Administered 2023-05-30: 2 mg via INTRAVENOUS

## 2023-05-30 MED ORDER — METOPROLOL SUCCINATE ER 50 MG PO TB24
25.0000 mg | ORAL_TABLET | Freq: Every day | ORAL | Status: DC
  Administered 2023-06-01: 25 mg via ORAL
  Filled 2023-05-30 (×3): qty 1

## 2023-05-30 MED ORDER — HEPARIN (PORCINE) IN NACL 1000-0.9 UT/500ML-% IV SOLN
INTRAVENOUS | Status: AC
  Filled 2023-05-30: qty 1000

## 2023-05-30 MED ORDER — ASPIRIN 81 MG PO CHEW
CHEWABLE_TABLET | ORAL | Status: AC
  Filled 2023-05-30: qty 1

## 2023-05-30 MED ORDER — NITROGLYCERIN 1 MG/10 ML FOR IR/CATH LAB
INTRA_ARTERIAL | Status: DC | PRN
  Administered 2023-05-30 (×3): 200 ug via INTRA_ARTERIAL

## 2023-05-30 MED ORDER — IOHEXOL 300 MG/ML  SOLN
INTRAMUSCULAR | Status: DC | PRN
  Administered 2023-05-30: 350 mL

## 2023-05-30 MED ORDER — SODIUM CHLORIDE 0.9% FLUSH: 3.0000 mL | Freq: Two times a day (BID) | INTRAVENOUS | Status: DC

## 2023-05-30 SURGICAL SUPPLY — 20 items
BALLN EUPHORA RX 2.0X12 (BALLOONS) ×1 IMPLANT
BALLOON EUPHORA RX 2.0X12 (BALLOONS) IMPLANT
CATH VISTA GUIDE 6FR JR4 ECOPK (CATHETERS) IMPLANT
DEVICE CLOSURE MYNXGRIP 6/7F (Vascular Products) IMPLANT
DRAPE BRACHIAL (DRAPES) IMPLANT
KIT ENCORE 26 ADVANTAGE (KITS) IMPLANT
NDL PERC 18GX7CM (NEEDLE) IMPLANT
NEEDLE PERC 18GX7CM (NEEDLE) ×1 IMPLANT
PACK CARDIAC CATH (CUSTOM PROCEDURE TRAY) ×1 IMPLANT
PROTECTION STATION PRESSURIZED (MISCELLANEOUS) ×1 IMPLANT
SET ATX-X65L (MISCELLANEOUS) IMPLANT
SHEATH AVANTI 6FR X 11CM (SHEATH) IMPLANT
STATION PROTECTION PRESSURIZED (MISCELLANEOUS) IMPLANT
STENT ONYX FRONTIER 2.0X18 (Permanent Stent) IMPLANT
STENT ONYX FRONTIER 2.25X12 (Permanent Stent) IMPLANT
TUBING CIL FLEX 10 FLL-RA (TUBING) IMPLANT
WIRE G HI TQ BMW 190 (WIRE) IMPLANT
WIRE GUIDERIGHT .035X150 (WIRE) IMPLANT
WIRE RUNTHROUGH .014X180CM (WIRE) IMPLANT
WIRE RUNTHROUGH IZANAI 014 180 (WIRE) IMPLANT

## 2023-05-30 NOTE — Progress Notes (Cosign Needed Addendum)
 Brief cardiology note (not for billing)  Patient presented today for outpatient staged stent placement for coronary artery disease.  Stent was placed to 95% mid RPDA lesion.  After this, the distal portion of the artery was noted to be completely stenosed.  Given the lesion was distal and small in size no further intervention was pursued.  While in recovery after his heart catheterization he began experiencing chest discomfort initially at a 3 out of 10 then increasing to about 5 out of 10.  EKG with some inferior ST elevation, reviewed by Dr. Juliann Pares.  I personally assessed the patient at bedside along with Dr. Juliann Pares.  He was started on IV nitroglycerin and IV heparin as ordered by myself.  He was also given IV morphine.  Given this he will be admitted to stepdown for further monitoring.   After morphine dose and uptitration of nitroglycerin infusion, patient developed hypotension. Bolus 250 cc IVF ordered by myself. Nitroglycerin dose decreased with improvement in BP and symptoms.   SignedGale Journey, PA-C  05/30/23 3:23 PM

## 2023-05-30 NOTE — Progress Notes (Signed)
 Minimal amount of blood noted under the PAD. Vinnie Langton, RN in special procedures to ask for advice. She recommended deflating the device and immediately reinflating it with 40 mL of air to insure that the device is inflated to the maximum amount and then continue to monitor for bleeding.   The device was deflated and reinflated per her suggestion.

## 2023-05-30 NOTE — Progress Notes (Signed)
 PHARMACY - ANTICOAGULATION CONSULT NOTE  Pharmacy Consult for Heparin Infusion- No bolus Indication: chest pain/ACS  Allergies  Allergen Reactions   Niacin And Related    Penicillins     GI upset    Patient Measurements: Height: 5\' 8"  (172.7 cm) Weight: 88 kg (194 lb) IBW/kg (Calculated) : 68.4 HEPARIN DW (KG): 86.2  Vital Signs: Temp: 97.8 F (36.6 C) (04/09 1041) Temp Source: Oral (04/09 1041) BP: 157/88 (04/09 1041) Pulse Rate: 67 (04/09 1041)  Labs: No results for input(s): "HGB", "HCT", "PLT", "APTT", "LABPROT", "INR", "HEPARINUNFRC", "HEPRLOWMOCWT", "CREATININE", "CKTOTAL", "CKMB", "TROPONINIHS" in the last 72 hours.  CrCl cannot be calculated (Patient's most recent lab result is older than the maximum 21 days allowed.).   Medical History: Past Medical History:  Diagnosis Date   Arthritis    Hyperlipidemia    Hypertension    Hypothyroidism    Wears hearing aid     Medications:  Not on anticoagulation at home  Started on bivalirubin in cath lab--> stopped at 1336   Assessment: Patient is a 73 year old male who presented for cardiac cath for new onset angina. Pharmacy has been consulted to start patient on a heparin infusion for ACS.   Baseline INR, aPTT, and CBC ordered.  No signs/symptoms of bleeding noted.   Goal of Therapy:  Heparin level 0.3-0.7 units/ml Monitor platelets by anticoagulation protocol: Yes   Plan:  Will not give bolus per provider Start heparin infusion at 1000 units/hr Check heparin level in 8 hours after start of infusion Monitor CBC daily while on heparin  Merryl Hacker, PharmD Clinical Pharmacist  05/30/2023,2:54 PM

## 2023-05-30 NOTE — Progress Notes (Signed)
 Patient ID: Don Holder, male   DOB: August 26, 1950, 73 y.o.   MRN: [id number]  Asked to  Evaluate abnormal EKG and patient with ST elevation concerning for STEMI  Patient status post PDA intervention with distal PDA occlusion mid to proximal PDA vein with 2 overlapping stents with very distal tip of the PDA became obstructed by the end of the case the patient remained hemodynamically stable..  Patient was maintained on prasugrel aspirin had very low level chest discomfort but hemodynamically stable  Postprocedure in specials repeat EKG suggestive of ST elevation anteriorly inferiorly chest pain 4 -5/10  After evaluate the patient he appeared to be unchanged from the end of the intervention relatively comfortable low level pain we added IV nitro restarted heparin and given additional morphine  I reviewed the case with Dr. Okey Dupre who had help me with opinions during the actual intervention and had him review the EKGs and he also concurred with me that relook or repeat cath at this point was probably not going to be helpful and would recommend aggressive medical therapy  Patient was treated with IV nitro low-dose beta-blocker IV heparin for 24 hours and transferred to ICU for overnight stay  Will follow-up cardiac markers  If the patient remains stable anticipate discharge within 1 to 2 days  [facility]

## 2023-05-31 ENCOUNTER — Encounter: Payer: Self-pay | Admitting: Internal Medicine

## 2023-05-31 DIAGNOSIS — I25119 Atherosclerotic heart disease of native coronary artery with unspecified angina pectoris: Secondary | ICD-10-CM | POA: Diagnosis not present

## 2023-05-31 DIAGNOSIS — Z950 Presence of cardiac pacemaker: Secondary | ICD-10-CM | POA: Diagnosis not present

## 2023-05-31 LAB — CBC
HCT: 40.6 % (ref 39.0–52.0)
Hemoglobin: 13.9 g/dL (ref 13.0–17.0)
MCH: 29.3 pg (ref 26.0–34.0)
MCHC: 34.2 g/dL (ref 30.0–36.0)
MCV: 85.5 fL (ref 80.0–100.0)
Platelets: 192 10*3/uL (ref 150–400)
RBC: 4.75 MIL/uL (ref 4.22–5.81)
RDW: 14.4 % (ref 11.5–15.5)
WBC: 9 10*3/uL (ref 4.0–10.5)
nRBC: 0 % (ref 0.0–0.2)

## 2023-05-31 LAB — BASIC METABOLIC PANEL WITH GFR
Anion gap: 9 (ref 5–15)
BUN: 21 mg/dL (ref 8–23)
CO2: 22 mmol/L (ref 22–32)
Calcium: 8.5 mg/dL — ABNORMAL LOW (ref 8.9–10.3)
Chloride: 101 mmol/L (ref 98–111)
Creatinine, Ser: 1.01 mg/dL (ref 0.61–1.24)
GFR, Estimated: >60 mL/min (ref >60)
Glucose, Bld: 121 mg/dL — ABNORMAL HIGH (ref 70–99)
Potassium: 4 mmol/L (ref 3.5–5.1)
Sodium: 132 mmol/L — ABNORMAL LOW (ref 135–145)

## 2023-05-31 LAB — ECHOCARDIOGRAM COMPLETE
Area-P 1/2: 3.08 cm2
Height: 68 in
S' Lateral: 2.9 cm
Weight: 3068.8 [oz_av]

## 2023-05-31 LAB — GLUCOSE, CAPILLARY: Glucose-Capillary: 107 mg/dL — ABNORMAL HIGH (ref 70–99)

## 2023-05-31 LAB — HEPARIN LEVEL (UNFRACTIONATED)
Heparin Unfractionated: 0.28 [IU]/mL — ABNORMAL LOW (ref 0.30–0.70)
Heparin Unfractionated: 0.35 [IU]/mL (ref 0.30–0.70)
Heparin Unfractionated: 0.45 [IU]/mL (ref 0.30–0.70)

## 2023-05-31 MED ORDER — ROSUVASTATIN CALCIUM 10 MG PO TABS
20.0000 mg | ORAL_TABLET | Freq: Every day | ORAL | Status: DC
  Administered 2023-06-01: 20 mg via ORAL
  Filled 2023-05-31 (×2): qty 2

## 2023-05-31 MED ORDER — ISOSORBIDE MONONITRATE ER 30 MG PO TB24
30.0000 mg | ORAL_TABLET | Freq: Every day | ORAL | Status: DC
  Administered 2023-05-31 – 2023-06-01 (×2): 30 mg via ORAL
  Filled 2023-05-31 (×2): qty 1

## 2023-05-31 NOTE — Progress Notes (Signed)
 PHARMACY - ANTICOAGULATION CONSULT NOTE  Pharmacy Consult for Heparin Infusion- No bolus Indication: chest pain/ACS  Allergies  Allergen Reactions   Niacin And Related    Penicillins     GI upset    Patient Measurements: Height: 5\' 8"  (172.7 cm) Weight: 87 kg (191 lb 12.8 oz) IBW/kg (Calculated) : 68.4 HEPARIN DW (KG): 85.9  Vital Signs: Temp: 97.9 F (36.6 C) (04/10 0000) Temp Source: Axillary (04/10 0000) BP: 104/67 (04/10 0000) Pulse Rate: 61 (04/10 0000)  Labs: Recent Labs    05/30/23 1725 05/31/23 0013  HGB 15.1  --   HCT 44.8  --   PLT 221  --   APTT 62*  --   LABPROT 16.3*  --   INR 1.3*  --   HEPARINUNFRC  --  0.28*    CrCl cannot be calculated (Patient's most recent lab result is older than the maximum 21 days allowed.).   Medical History: Past Medical History:  Diagnosis Date   Arthritis    Hyperlipidemia    Hypertension    Hypothyroidism    Wears hearing aid     Medications:  Not on anticoagulation at home  Started on bivalirubin in cath lab--> stopped at 1336   Assessment: Patient is a 73 year old male who presented for cardiac cath for new onset angina. Pharmacy has been consulted to start patient on a heparin infusion for ACS.   Baseline INR, aPTT, and CBC ordered.  No signs/symptoms of bleeding noted.   Goal of Therapy:  Heparin level 0.3-0.7 units/ml Monitor platelets by anticoagulation protocol: Yes   Plan:  Will not give bolus per provider  4/10: HL @ 0013 = 0.28, SUBtherapeutic - Will increase drip rate to 1150 units/hr.  No bolus per MD request. - Will recheck HL 8 hrs after rate change.  Monitor CBC daily while on heparin  Analei Whinery D, PharmD Clinical Pharmacist  05/31/2023,12:42 AM

## 2023-05-31 NOTE — Progress Notes (Signed)
 PAD removed after complete deflation. PA at bedside to remove PAD. NO active bleeding at puncture site. Guaze and Tegaderm placed on site. Bruising noted and borders marked. Nitroglycerin gtt stopped and Imdur administered to patient. Heparin resumed one hour post PAD removal per verbal order by PA. Patient currently resting in bed with wife at bedside. No complaints of pain at this time. Will continue to monitor closely.

## 2023-05-31 NOTE — Plan of Care (Signed)
  Problem: Education: Goal: Understanding of CV disease, CV risk reduction, and recovery process will improve Outcome: Progressing   Problem: Activity: Goal: Ability to return to baseline activity level will improve Outcome: Progressing   Problem: Cardiovascular: Goal: Ability to achieve and maintain adequate cardiovascular perfusion will improve Outcome: Progressing Goal: Vascular access site(s) Level 0-1 will be maintained Outcome: Progressing   Problem: Clinical Measurements: Goal: Will remain free from infection Outcome: Progressing Goal: Respiratory complications will improve Outcome: Progressing Goal: Cardiovascular complication will be avoided Outcome: Progressing   Problem: Activity: Goal: Risk for activity intolerance will decrease Outcome: Progressing   Problem: Nutrition: Goal: Adequate nutrition will be maintained Outcome: Progressing   Problem: Coping: Goal: Level of anxiety will decrease Outcome: Progressing   Problem: Elimination: Goal: Will not experience complications related to bowel motility Outcome: Progressing Goal: Will not experience complications related to urinary retention Outcome: Progressing   Problem: Pain Managment: Goal: General experience of comfort will improve and/or be controlled Outcome: Progressing

## 2023-05-31 NOTE — H&P (Signed)
 [facility] CLINIC CARDIOLOGY HISTORY & PHYSICAL     History and Physical Patient ID: Don Holder MRN: [id number] DOB/AGE: [birth year] 73 y.o. Admit date: 05/30/2023  Primary Care Physician: Danella Penton, MD Primary Cardiologist Dr. Dorothyann Peng  HPI:     Patient presented 05/30/2023 for outpatient staged stent placement for coronary artery disease.  Stent was placed to 95% mid RPDA lesion.  After this, the distal portion of the artery was noted to be completely stenosed.  Given the lesion was distal and small in size no further intervention was pursued.  While in recovery after his heart catheterization he began experiencing chest discomfort initially at a 3 out of 10 then increasing to about 5 out of 10.  EKG with some inferior ST elevation, reviewed by Dr. Juliann Pares.  I personally assessed the patient at bedside along with Dr. Juliann Pares.  He was started on IV nitroglycerin and IV heparin as ordered by myself.  He was also given IV morphine.  He did develop transient hypotension after the initiation of nitroglycerin, this improved with IV fluids and decreased dose of nitro. Given this he was admitted to stepdown for further monitoring.      Interval history: -Patient seen and examined this morning with wife and daughter present at bedside.  He reports that he did have some chest pain overnight but this seems to be improving this morning. -BP and heart rate remained stable, no further episodes of hypotension. -Nursing staff has been unable to remove PAD device due to continued bleeding from his left femoral access site.   Past Medical History:  Diagnosis Date   Arthritis    Hyperlipidemia    Hypertension    Hypothyroidism    Wears hearing aid     Past Surgical History:  Procedure Laterality Date   APPENDECTOMY     BACK SURGERY     CHOLECYSTECTOMY  07/2022   COLONOSCOPY     COLONOSCOPY WITH PROPOFOL N/A 10/04/2020   Procedure: COLONOSCOPY WITH PROPOFOL;  Surgeon: Regis Bill, MD;  Location: [facility] ENDOSCOPY;  Service: Endoscopy;  Laterality: N/A;   CORONARY STENT INTERVENTION N/A 05/30/2023   Procedure: CORONARY STENT INTERVENTION;  Surgeon: Alwyn Pea, MD;  Location: [facility] INVASIVE CV LAB;  Service: Cardiovascular;  Laterality: N/A;   EXCISION PARTIAL PHALANX Bilateral 03/23/2021   Procedure: EXCISION PARTIAL PHALANX - FOURTH TOES;  Surgeon: Gwyneth Revels, DPM;  Location: [facility];  Service: Podiatry;  Laterality: Bilateral;  Anesthesia: Choice   HAMMER TOE SURGERY Bilateral 03/23/2021   Procedure: HAMMER TOE CORRECTION - FIFTH TOES;  Surgeon: Gwyneth Revels, DPM;  Location: [facility];  Service: Podiatry;  Laterality: Bilateral;   LEFT HEART CATH AND CORONARY ANGIOGRAPHY Left 05/25/2023   Procedure: LEFT HEART CATH AND CORONARY ANGIOGRAPHY;  Surgeon: Alwyn Pea, MD;  Location: [facility] INVASIVE CV LAB;  Service: Cardiovascular;  Laterality: Left;   SKIN CANCER EXCISION      Medications Prior to Admission  Medication Sig Dispense Refill Last Dose/Taking   amLODipine (NORVASC) 5 MG tablet Take 5 mg by mouth daily.   05/29/2023 Evening   aspirin 81 MG chewable tablet Chew 81 mg by mouth daily.   05/29/2023 Evening   bisoprolol (ZEBETA) 5 MG tablet Take 5 mg by mouth daily.   05/30/2023 Morning   carboxymethylcellul-glycerin (OPTIVE) 0.5-0.9 % ophthalmic solution Place 1 drop into both eyes 2 (two) times daily.   05/30/2023 at  7:00 AM   Cyanocobalamin (VITAMIN B 12 PO) Take  3,000 mcg by mouth daily.   Past Week   cycloSPORINE, PF, (CEQUA) 0.09 % SOLN Place 1 drop into both eyes 2 (two) times daily.   05/30/2023 at  7:00 AM   ibuprofen (ADVIL) 600 MG tablet Take 1 tablet (600 mg total) by mouth every 6 (six) hours as needed. 30 tablet 0 Past Week   levothyroxine (SYNTHROID) 75 MCG tablet Take 75 mcg by mouth daily before breakfast.   05/30/2023 at  2:00 AM   pantoprazole (PROTONIX) 40 MG tablet Take 40 mg by mouth daily.   05/30/2023 at  7:00 AM    vitamin C (ASCORBIC ACID) 500 MG tablet Take 500 mg by mouth daily as needed (Cold).   Past Month   Social History   Socioeconomic History   Marital status: Married    Spouse name: Not on file   Number of children: Not on file   Years of education: Not on file   Highest education level: Not on file  Occupational History   Not on file  Tobacco Use   Smoking status: Never    Passive exposure: Never   Smokeless tobacco: Never  Vaping Use   Vaping status: Never Used  Substance and Sexual Activity   Alcohol use: Never   Drug use: Never   Sexual activity: Not on file  Other Topics Concern   Not on file  Social History Narrative   Lives with wife, Beverely Low. No indoor pets.     Social Drivers of Corporate investment banker Strain: Low Risk  (05/25/2022)   [facility], [facility]   Overall Financial Resource Strain (CARDIA)    Difficulty of Paying Living Expenses: Not hard at all  Food Insecurity: No Food Insecurity (05/30/2023)   Hunger Vital Sign    Worried About Running Out of Food in the Last Year: Never true    Ran Out of Food in the Last Year: Never true  Transportation Needs: No Transportation Needs (05/30/2023)   PRAPARE - Administrator, Civil Service (Medical): No    Lack of Transportation (Non-Medical): No  Physical Activity: Not on file  Stress: Not on file  Social Connections: Socially Integrated (05/30/2023)   Social Connection and Isolation Panel [NHANES]    Frequency of Communication with Friends and Family: More than three times a week    Frequency of Social Gatherings with Friends and Family: More than three times a week    Attends Religious Services: More than 4 times per year    Active Member of [employer] or Organizations: Yes    Attends Banker Meetings: More than 4 times per year    Marital Status: Married  Catering manager Violence: Not At Risk (05/30/2023)   Humiliation, Afraid, Rape, and  Kick questionnaire    Fear of Current or Ex-Partner: No    Emotionally Abused: No    Physically Abused: No    Sexually Abused: No    History reviewed. No pertinent family history.    Review of systems complete and found to be negative unless listed above      Physical Exam:  General: Well developed, well nourished, in no acute distress HEENT:  Normocephalic and atramatic Neck:  No JVD.  Lungs: Clear bilaterally to auscultation and percussion. Heart: HRRR . Normal S1 and S2 without gallops or murmurs.  Abdomen: Bowel sounds are positive, abdomen soft and non-tender  Msk:  Back normal, normal gait. Normal strength and tone  for age. Extremities: No clubbing, cyanosis or edema.  Left femoral access site with PAD device in place, no apparent active bleeding or bruising. Neuro: Alert and oriented X 3. Psych:  Good affect, responds appropriately   Labs:   Lab Results  Component Value Date   WBC 9.0 05/31/2023   HGB 13.9 05/31/2023   HCT 40.6 05/31/2023   MCV 85.5 05/31/2023   PLT 192 05/31/2023    Recent Labs  Lab 05/31/23 0415  NA 132*  K 4.0  CL 101  CO2 22  BUN 21  CREATININE 1.01  CALCIUM 8.5*  GLUCOSE 121*   Lab Results  Component Value Date   CKMB 5.4 (H) 12/28/2013   TROPONINI < 0.02 12/28/2013    Lab Results  Component Value Date   CHOL 188 12/28/2013   Lab Results  Component Value Date   HDL 33 (L) 12/28/2013   Lab Results  Component Value Date   LDLCALC 109 (H) 12/28/2013   Lab Results  Component Value Date   TRIG 231 (H) 12/28/2013   No results found for: "CHOLHDL" No results found for: "LDLDIRECT"    Radiology: ECHOCARDIOGRAM COMPLETE Result Date: 05/31/2023    ECHOCARDIOGRAM REPORT   Patient Name:   BULMARO FEAGANS Date of Exam: 05/30/2023 Medical Rec #:  [id number]      Height:       68.0 in Accession #:    [id number]     Weight:       191.8 lb Date of Birth:  1950-05-22       BSA:          2.008 m Patient Age:    72 years       BP:            99/70 mmHg Patient Gender: M              HR:           61 bpm. Exam Location:  [facility] Procedure: 2D Echo, Cardiac Doppler, Color Doppler and Intracardiac            Opacification Agent (Both Spectral and Color Flow Doppler were            utilized during procedure). Indications:     I21.9 Myocardial Infarction  History:         Patient has no prior history of Echocardiogram examinations.                  Risk Factors:Hypertension and Dyslipidemia.  Sonographer:     Daphine Deutscher RDCS Referring Phys:  [id number] [facility] Cheyanna Strick Diagnosing Phys: Mellody Drown Alluri IMPRESSIONS  1. Left ventricular ejection fraction, by estimation, is 55 to 60%. The left ventricle has normal function. The left ventricle has no regional wall motion abnormalities. There is mild left ventricular hypertrophy. Left ventricular diastolic parameters are consistent with Grade I diastolic dysfunction (impaired relaxation).  2. Right ventricular systolic function is normal. The right ventricular size is mildly enlarged.  3. The mitral valve is normal in structure. Trivial mitral valve regurgitation.  4. The aortic valve is tricuspid. Aortic valve regurgitation is not visualized. FINDINGS  Left Ventricle: Left ventricular ejection fraction, by estimation, is 55 to 60%. The left ventricle has normal function. The left ventricle has no regional wall motion abnormalities. Definity contrast agent was given IV to delineate the left ventricular  endocardial borders. The left ventricular internal cavity size was normal in size. There is mild left ventricular hypertrophy. Left ventricular diastolic  parameters are consistent with Grade I diastolic dysfunction (impaired relaxation). Right Ventricle: The right ventricular size is mildly enlarged. No increase in right ventricular wall thickness. Right ventricular systolic function is normal. Left Atrium: Left atrial size was normal in size. Right Atrium: Right atrial size was normal in size. Pericardium:  There is no evidence of pericardial effusion. Mitral Valve: The mitral valve is normal in structure. Trivial mitral valve regurgitation. Tricuspid Valve: The tricuspid valve is normal in structure. Tricuspid valve regurgitation is trivial. Aortic Valve: The aortic valve is tricuspid. Aortic valve regurgitation is not visualized. Pulmonic Valve: The pulmonic valve was not well visualized. Pulmonic valve regurgitation is not visualized. Aorta: The aortic root and ascending aorta are structurally normal, with no evidence of dilitation. IAS/Shunts: The atrial septum is grossly normal.  LEFT VENTRICLE PLAX 2D LVIDd:         4.70 cm   Diastology LVIDs:         2.90 cm   LV e' medial:    6.09 cm/s LV PW:         1.10 cm   LV E/e' medial:  7.7 LV IVS:        1.00 cm   LV e' lateral:   5.28 cm/s LVOT diam:     2.10 cm   LV E/e' lateral: 8.9 LV SV:         81 LV SV Index:   40 LVOT Area:     3.46 cm  RIGHT VENTRICLE RV Basal diam:  4.40 cm RV S prime:     14.60 cm/s TAPSE (M-mode): 2.0 cm LEFT ATRIUM             Index        RIGHT ATRIUM           Index LA diam:        3.90 cm 1.94 cm/m   RA Area:     12.90 cm LA Vol (A2C):   36.7 ml 18.28 ml/m  RA Volume:   29.20 ml  14.54 ml/m LA Vol (A4C):   25.3 ml 12.60 ml/m LA Biplane Vol: 31.8 ml 15.84 ml/m  AORTIC VALVE LVOT Vmax:   110.00 cm/s LVOT Vmean:  66.900 cm/s LVOT VTI:    0.234 m  AORTA Ao Root diam: 3.80 cm Ao Asc diam:  3.50 cm MITRAL VALVE               TRICUSPID VALVE MV Area (PHT): 3.08 cm    TR Peak grad:   15.8 mmHg MV Decel Time: 246 msec    TR Vmax:        199.00 cm/s MV E velocity: 46.90 cm/s MV A velocity: 62.40 cm/s  SHUNTS MV E/A ratio:  0.75        Systemic VTI:  0.23 m                            Systemic Diam: 2.10 cm Windell Norfolk Electronically signed by Windell Norfolk Signature Date/Time: 05/31/2023/7:38:03 AM    Final    CARDIAC CATHETERIZATION Result Date: 05/30/2023   Prox LAD lesion is 60% stenosed.   RPDA-1 lesion is 95% stenosed.   RPDA-2  lesion is 100% stenosed.   A drug-eluting stent was successfully placed using a STENT ONYX FRONTIER 2.0X18.   A drug-eluting stent was successfully placed using a STENT ONYX FRONTIER 2.25X12.   Post intervention, there is a 0% residual stenosis.  In the absence of any other complications or medical issues, we expect the patient to be ready for discharge from an interventional cardiology perspective.   Recommend uninterrupted dual antiplatelet therapy with Aspirin 81mg  daily and Prasugrel 10mg  daily for a minimum of 12 months (ACS-Class I recommendation). Conclusion Outpatient PCI and stent to proximal to mid PDA Patient brought to the Cath Lab left groin approach because of difficulty with right radial access patient has hematoma of the right groin from previous failed closure device 6 [place] JR4 guide BMW wire Angiomax for anticoagulation Prasugrel aspirin load Predilatation balloon with a 2.0 x 15 mm trek Deployment of a 2.0 x 18 mm frontier Onyx to 12 atm Second stent placed was a 2.25 x 12 mm frontier Onyx overlapping the proximal portion of the previously placed stent up to 14 atm Very distal tip of PDA was occluded either from wire dissection or thrombus or plaque emboli After reviewing the images with the other interventionalists Dr. Okey Dupre he recommended conservative medical therapy and no further intervention Patient pain improved at the end of the case to about 3 or 4 hemodynamically stable Patient transferred to specials for conservative management Successful PCI and stent proximal to mid PDA with 2 overlapping stents Complicated by distal PDA occlusion possibly from dissection presumably from the wire or propagated forward from balloon dilatation Recommend conservative medical therapy   CT ABDOMEN PELVIS W WO CONTRAST Result Date: 05/29/2023 CLINICAL DATA:  Lung bases are clear. No pleural effusion. Coronary artery calcifications are seen. EXAM: CT ABDOMEN AND PELVIS WITHOUT AND WITH CONTRAST TECHNIQUE:  Multidetector CT imaging of the abdomen and pelvis was performed following the standard protocol before and following the bolus administration of intravenous contrast. RADIATION DOSE REDUCTION: This exam was performed according to the departmental dose-optimization program which includes automated exposure control, adjustment of the mA and/or kV according to patient size and/or use of iterative reconstruction technique. CONTRAST:  OMNIPAQUE IOHEXOL 300 MG/ML  SOLN COMPARISON:  Ultrasound and CT 07/12/2022. FINDINGS: Lower chest: Slight linear opacity lung bases likely scar or atelectasis. No pleural effusion. Hepatobiliary: Segment 2 hepatic cyst identified, unchanged from previous. Patent portal vein. Previous cholecystectomy. Pancreas: Unremarkable. No pancreatic ductal dilatation or surrounding inflammatory changes. Spleen: There is a new low-density area posterior along the spleen measuring 3.1 cm, area of poor enhancement. Please correlate for etiology distant involving infarct. Adrenals/Urinary Tract: Adrenal glands preserved. No enhancing renal mass. Multiple bilateral parapelvic renal cysts. No collecting system dilatation. The ureters have normal course and caliber down to the bladder. Preserved contour to the urinary bladder. Stomach/Bowel: Large bowel has a normal course and caliber with diffuse colonic stool. Normal appendix. Stomach and small bowel are nondilated. High density debris seen in the stomach and duodenum. Sigmoid colon diverticula. Vascular/Lymphatic: Normal caliber IVC. There is atherosclerotic changes along the aorta. There is small saccular aneurysm extending left lateral from the abdominal aorta. Diameter of the aorta in this location maximally measures 3 cm x 2.4 cm. Previously this would have measured 2.9 by 2.2 cm. There is some soft tissue thickening and stranding in the area of the right common femoral artery. Please correlate for any history including any evidence of prior  intervention. Reproductive: Prostate is unremarkable. Other: Small fat containing umbilical hernia. No free air or free fluid. Musculoskeletal: Scattered degenerative changes of the spine and pelvis. 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 Degenerative changes elsewhere along the pelvis with areas of sclerosis. Appearance is similar to previous. IMPRESSION: New  3 cm low-attenuation area posteriorly along the spleen which is somewhat geographic in appearance extends out to the capsule. 1 possibility could be an evolving splenic infarct or injury. Please correlate for any known history and recommend follow-up. No bowel obstruction, free air or free fluid. Scattered stool. Normal appendix. Multiple bilateral parapelvic cysts. Saccular a inferior abdominal aortic aneurysm measuring up to 3 cm. Recommend referral to or continued care with vascular specialist. (Ref.: J Vasc Surg. 2018; 67:2-77 and J Am Coll Radiol 2013;10(10):789-794.) New stranding and thickening in the area of the right inguinal region adjacent to the common femoral artery. Please correlate for any history including prior intervention. Patient history of prior heart catheterization. Electronically Signed   By: Karen Kays M.D.   On: 05/29/2023 13:12   CARDIAC CATHETERIZATION Result Date: 05/28/2023   RPDA lesion is 95% stenosed.   Prox LAD lesion is 60% stenosed.   The left ventricular systolic function is normal.   LV end diastolic pressure is normal.   The left ventricular ejection fraction is 55-65% by visual estimate.   There is no mitral valve regurgitation.   In the absence of any other complications or medical issues, we expect the patient to be ready for discharge from an interventional cardiology perspective.   Recommend Aspirin 81mg  daily for moderate CAD. Conclusion Outpatient left heart cath angiogram positive imaging study Left heart cath possible PCI and stent LMain minor irregularly LAD 60% proximal LAD bifurcation Circ large minor  irregularity RCA large 95% mid PDA Right dominant system Intervention deferred Consider CT surgery referral vs Complex PCI Consider PCI of PDA as if no treatment of LAD indicated    EKG: sinus rhythm rate 60 bpm, inferior STE improved compared to EKG yesterday  ASSESSMENT AND PLAN:  Mr. KHIEM GARGIS is a 73 y.o. male who presented for outpatient cardiac catheterization with staged PCI 05/29/2023 which was complicated by distal stenosis of the PDA after stent placement.  Post catheterization he developed acute chest pain and was started on IV nitroglycerin and IV heparin.  EKG done at that time revealed inferior ST elevation.  After initiation of IV nitroglycerin he developed transient hypotension with improved with IV fluids and decreased dose of IV nitroglycerin.  He was admitted for further evaluation.  # Coronary artery disease # S/p coronary stent placement # Hypotension Patient reports he is feeling better this morning overall, had some chest pain overnight.  PAD remains in place due to continued bleeding when pressure has been decreased. -Discussed with Dr. Juliann Pares who recommended holding heparin and removing air from PAD until this has been removed, recommends resuming heparin once PAD is off. -Start Crestor 20 mg daily.  Continue aspirin 81 mg daily, prasugrel 10 mg daily. -Continue metoprolol succinate 25 mg daily.  This patient's plan of care was discussed and created with Dr. Corky Sing and he is in agreement.    SignedGale Journey PA-C 05/31/2023, 10:04 AM

## 2023-05-31 NOTE — Progress Notes (Signed)
 Cardiology PA verbalized to RN to pause heparin for 30 minutes, begin attempting PAD inflation. When PAD successfully deflated and removed without bleeding for 1 hour, RN to contact PA for bedside assessment. Family provided update by RN within RN scope.

## 2023-05-31 NOTE — Plan of Care (Signed)
  Problem: Education: Goal: Understanding of CV disease, CV risk reduction, and recovery process will improve Outcome: Progressing   Problem: Cardiovascular: Goal: Ability to achieve and maintain adequate cardiovascular perfusion will improve Outcome: Progressing Goal: Vascular access site(s) Level 0-1 will be maintained Outcome: Progressing Note: Assess vascular access site(s) for complications and intervene as needed    Problem: Clinical Measurements: Goal: Diagnostic test results will improve Outcome: Progressing Goal: Cardiovascular complication will be avoided Outcome: Progressing   Problem: Coping: Goal: Level of anxiety will decrease Outcome: Progressing   Problem: Pain Managment: Goal: General experience of comfort will improve and/or be controlled Outcome: Progressing

## 2023-05-31 NOTE — Progress Notes (Signed)
 PHARMACY - ANTICOAGULATION CONSULT NOTE  Pharmacy Consult for Heparin Infusion- No bolus Indication: chest pain/ACS  Allergies  Allergen Reactions   Niacin And Related    Penicillins     GI upset    Patient Measurements: Height: 5\' 8"  (172.7 cm) Weight: 87 kg (191 lb 12.8 oz) IBW/kg (Calculated) : 68.4 HEPARIN DW (KG): 85.9  Vital Signs: Temp: 97.6 F (36.4 C) (04/10 1200) Temp Source: Axillary (04/10 1200) BP: 112/68 (04/10 1200) Pulse Rate: 59 (04/10 1200)  Labs: Recent Labs    05/30/23 1725 05/31/23 0013 05/31/23 0415 05/31/23 0858  HGB 15.1  --  13.9  --   HCT 44.8  --  40.6  --   PLT 221  --  192  --   APTT 62*  --   --   --   LABPROT 16.3*  --   --   --   INR 1.3*  --   --   --   HEPARINUNFRC  --  0.28*  --  0.45  CREATININE  --   --  1.01  --     Estimated Creatinine Clearance: 70.9 mL/min (by C-G formula based on SCr of 1.01 mg/dL).   Medical History: Past Medical History:  Diagnosis Date   Arthritis    Hyperlipidemia    Hypertension    Hypothyroidism    Wears hearing aid     Medications:  Not on anticoagulation at home  Started on bivalirubin in cath lab--> stopped at 1336   Assessment: Patient is a 73 year old male who presented for cardiac cath for new onset angina. Pharmacy has been consulted to start patient on a heparin infusion for ACS.   Baseline INR, aPTT, and CBC ordered.  No signs/symptoms of bleeding noted.   Goal of Therapy:  Heparin level 0.3-0.7 units/ml Monitor platelets by anticoagulation protocol: Yes  No boluses per MD request.  4/10: HL @ 0013 = 0.28, SUBtherapeutic 4/10: HL @ 0858 = 0.45, therapeutic x 1   Plan:  - Heparin paused d/t continued bleeding/oozing seen post-cath. Plan to resume once air from PAD has been removed, which is planned for 1445 today - Resume heparin drip at previous rate of 1150 units/hr.   - Will recheck HL 8 hrs after restart - Monitor CBC daily while on heparin  Bettey Costa,  PharmD Clinical Pharmacist  05/31/2023,2:17 PM

## 2023-05-31 NOTE — Progress Notes (Signed)
 PHARMACY - ANTICOAGULATION CONSULT NOTE  Pharmacy Consult for Heparin Infusion- No bolus Indication: chest pain/ACS  Allergies  Allergen Reactions   Niacin And Related    Penicillins     GI upset    Patient Measurements: Height: 5\' 8"  (172.7 cm) Weight: 87 kg (191 lb 12.8 oz) IBW/kg (Calculated) : 68.4 HEPARIN DW (KG): 85.9  Vital Signs: Temp: 98 F (36.7 C) (04/10 2000) Temp Source: Oral (04/10 2000) BP: 102/55 (04/10 2200) Pulse Rate: 61 (04/10 2200)  Labs: Recent Labs    05/30/23 1725 05/31/23 0013 05/31/23 0415 05/31/23 0858 05/31/23 2245  HGB 15.1  --  13.9  --   --   HCT 44.8  --  40.6  --   --   PLT 221  --  192  --   --   APTT 62*  --   --   --   --   LABPROT 16.3*  --   --   --   --   INR 1.3*  --   --   --   --   HEPARINUNFRC  --  0.28*  --  0.45 0.35  CREATININE  --   --  1.01  --   --     Estimated Creatinine Clearance: 70.9 mL/min (by C-G formula based on SCr of 1.01 mg/dL).   Medical History: Past Medical History:  Diagnosis Date   Arthritis    Hyperlipidemia    Hypertension    Hypothyroidism    Wears hearing aid     Medications:  Not on anticoagulation at home  Started on bivalirubin in cath lab--> stopped at 1336   Assessment: Patient is a 73 year old male who presented for cardiac cath for new onset angina. Pharmacy has been consulted to start patient on a heparin infusion for ACS.   Baseline INR, aPTT, and CBC ordered.  No signs/symptoms of bleeding noted.   Goal of Therapy:  Heparin level 0.3-0.7 units/ml Monitor platelets by anticoagulation protocol: Yes  No boluses per MD request.  4/10: HL @ 0013 = 0.28, SUBtherapeutic 4/10: HL @ 0858 = 0.45, therapeutic x 1 4/10: HL @ 2245 = 0.34, therapeutic X 1 following restart  Plan:  4/10: HL @ 2245 = 0.34, therapeutic X 1 following restart - Will continue pt on current rate and recheck HL in 8 hrs on 4/11 @ 0700 - CBC daily   Raider Valbuena D, PharmD Clinical Pharmacist   05/31/2023,11:21 PM

## 2023-06-01 LAB — CBC
HCT: 44.3 % (ref 39.0–52.0)
Hemoglobin: 14.8 g/dL (ref 13.0–17.0)
MCH: 29.1 pg (ref 26.0–34.0)
MCHC: 33.4 g/dL (ref 30.0–36.0)
MCV: 87 fL (ref 80.0–100.0)
Platelets: 208 10*3/uL (ref 150–400)
RBC: 5.09 MIL/uL (ref 4.22–5.81)
RDW: 14.5 % (ref 11.5–15.5)
WBC: 9.7 10*3/uL (ref 4.0–10.5)
nRBC: 0 % (ref 0.0–0.2)

## 2023-06-01 LAB — BASIC METABOLIC PANEL WITH GFR
Anion gap: 9 (ref 5–15)
BUN: 22 mg/dL (ref 8–23)
CO2: 26 mmol/L (ref 22–32)
Calcium: 8.7 mg/dL — ABNORMAL LOW (ref 8.9–10.3)
Chloride: 101 mmol/L (ref 98–111)
Creatinine, Ser: 1.18 mg/dL (ref 0.61–1.24)
GFR, Estimated: >60 mL/min (ref >60)
Glucose, Bld: 110 mg/dL — ABNORMAL HIGH (ref 70–99)
Potassium: 4.2 mmol/L (ref 3.5–5.1)
Sodium: 136 mmol/L (ref 135–145)

## 2023-06-01 LAB — LIPOPROTEIN A (LPA): Lipoprotein (a): 72.7 nmol/L — ABNORMAL HIGH (ref <75.0)

## 2023-06-01 MED ORDER — ISOSORBIDE MONONITRATE ER 30 MG PO TB24: 30.0000 mg | ORAL_TABLET | Freq: Every day | ORAL | Status: AC

## 2023-06-01 MED ORDER — METOPROLOL SUCCINATE ER 25 MG PO TB24: 25.0000 mg | ORAL_TABLET | Freq: Every day | ORAL | Status: AC

## 2023-06-01 MED ORDER — EZETIMIBE 10 MG PO TABS: 10.0000 mg | ORAL_TABLET | Freq: Every day | ORAL | 0 refills | Status: AC

## 2023-06-01 MED ORDER — PRASUGREL HCL 10 MG PO TABS: 10.0000 mg | ORAL_TABLET | Freq: Every day | ORAL | 0 refills | Status: AC

## 2023-06-01 NOTE — Discharge Summary (Signed)
 Discharge Summary      Patient ID: Don Holder MRN: [id number] DOB/AGE: 04/26/1950 73 y.o.  Admit date: 05/30/2023 Discharge date: 06/01/2023  Primary Discharge Diagnosis Coronary artery disease   Secondary Discharge Diagnosis S/p coronary stent placement  Significant Diagnostic Studies: Left heart catheterization  Consults: None  Hospital Course: The patient was brought to the cardiac cath lab and underwent left heart catheterization and coronary angiography with Don Holder on 05/30/2023. The patient tolerated with procedure.  The patient received 2.0 x 18 mm frontier Onyx and 2.25 x 12 mm frontier Onyx overlapping stents to the proximal to mid PDA.  Post procedurally he developed worsening chest pain was started on IV nitroglycerin and IV heparin.  EKG revealed some ST elevation in inferior leads.  Don Holder will discuss case with additional interventionalists Don Holder who both agreed that taking him back for relook cath would not be beneficial and plan was made to pursue medical management.  He also developed hypotension after starting IV nitroglycerin, dose was reduced and he was given a 250 mL IV fluid bolus and BP improved.  Chest pain improved overnight on 4/10 and as of yesterday his pain had resolved.  Blood pressure has remained stable throughout admission.  On 4/10 and 4/11 the L groin access site was examined and found to be without significant erythema, tenderness to palpation, or apparent aneurysm. Hospital course was overall uneventful, on day of discharge the patient was ambulatory and eager to go home.  Discussed cardiac rehab and new prescription medications in detail. The patient was given aftercare instructions and ER return precautions. Will arrange for follow up in office in 1 week, or sooner if needed.      Discharge Exam: Blood pressure 130/85, pulse 70, temperature 98 F (36.7 C), temperature source Oral, resp. rate 15, height 5\' 8"  (1.727 m), weight 87 kg, SpO2 96%.    General: Well appearing male, well nourished, in no acute distress.  HEENT:  Normocephalic and atraumatic. Neck:   No JVD.  Lungs: Normal respiratory effort on room air.  Clear to ascultation bilaterally. Heart: HRRR . Normal S1 and S2 without gallops or murmurs.  Abdomen: non-distended appearing.  Msk: Normal strength and tone for age. Extremities: No peripheral edema. L groin access site without bleeding, significant tenderness to palpation, apparent aneurysm, or significant ecchymosis. Covered with clean gauze and tegaderm dressing.  Neuro: Alert and oriented x3 Psych:  calm and cooperative.   Labs:   Lab Results  Component Value Date   WBC 9.7 06/01/2023   HGB 14.8 06/01/2023   HCT 44.3 06/01/2023   MCV 87.0 06/01/2023   PLT 208 06/01/2023    Recent Labs  Lab 06/01/23 0742  NA 136  K 4.2  CL 101  CO2 26  BUN 22  CREATININE 1.18  CALCIUM 8.7*  GLUCOSE 110*      Radiology: None EKG: sinus rhythm rate 60 bpm  FOLLOW UP PLANS AND APPOINTMENTS Discharge Instructions     AMB Referral to Cardiac Rehabilitation - Phase II   Complete by: As directed    Diagnosis: Coronary Stents   After initial evaluation and assessments completed: Virtual Based Care may be provided alone or in conjunction with Phase 2 Cardiac Rehab based on patient barriers.: Yes      Allergies as of 06/01/2023       Reactions   Niacin And Related    Penicillins    GI upset        Medication List  STOP taking these medications    bisoprolol 5 MG tablet Commonly known as: ZEBETA       TAKE these medications    amLODipine 5 MG tablet Commonly known as: NORVASC Take 5 mg by mouth daily.   ascorbic acid 500 MG tablet Commonly known as: VITAMIN C Take 500 mg by mouth daily as needed (Cold).   aspirin 81 MG chewable tablet Chew 81 mg by mouth daily.   Cequa 0.09 % Soln Generic drug: cycloSPORINE (PF) Place 1 drop into both eyes 2 (two) times daily.   ezetimibe 10 MG  tablet Commonly known as: Zetia Take 1 tablet (10 mg total) by mouth daily.   ibuprofen 600 MG tablet Commonly known as: ADVIL Take 1 tablet (600 mg total) by mouth every 6 (six) hours as needed.   isosorbide mononitrate 30 MG 24 hr tablet Commonly known as: IMDUR Take 1 tablet (30 mg total) by mouth daily.   levothyroxine 75 MCG tablet Commonly known as: SYNTHROID Take 75 mcg by mouth daily before breakfast.   metoprolol succinate 25 MG 24 hr tablet Commonly known as: TOPROL-XL Take 1 tablet (25 mg total) by mouth daily. Take with or immediately following a meal.   OPTIVE 0.5-0.9 % ophthalmic solution Generic drug: carboxymethylcellul-glycerin Place 1 drop into both eyes 2 (two) times daily.   pantoprazole 40 MG tablet Commonly known as: PROTONIX Take 40 mg by mouth daily.   prasugrel 10 MG Tabs tablet Commonly known as: EFFIENT Take 1 tablet (10 mg total) by mouth daily.   VITAMIN B 12 PO Take 3,000 mcg by mouth daily.        Follow-up Information     Don Pea, MD. Go in 1 week(s).   Specialties: Cardiology, Internal Medicine Contact information: [address] [place] [address] [phone]                 PLEASE BRING ALL MEDICATIONS WITH YOU TO FOLLOW UP APPOINTMENTS  Time spent with patient: 74  Signed: Gale Journey PA-C 06/01/2023, 9:39 AM [facility] Card[facility]

## 2023-06-01 NOTE — Plan of Care (Signed)
  Problem: Activity: Goal: Ability to return to baseline activity level will improve Outcome: Progressing   Problem: Cardiovascular: Goal: Ability to achieve and maintain adequate cardiovascular perfusion will improve Outcome: Progressing Goal: Vascular access site(s) Level 0-1 will be maintained Outcome: Progressing   Problem: Health Behavior/Discharge Planning: Goal: Ability to safely manage health-related needs after discharge will improve Outcome: Progressing   Problem: Clinical Measurements: Goal: Diagnostic test results will improve Outcome: Progressing Goal: Cardiovascular complication will be avoided Outcome: Progressing   Problem: Activity: Goal: Risk for activity intolerance will decrease Outcome: Progressing   Problem: Coping: Goal: Level of anxiety will decrease Outcome: Progressing   Problem: Pain Managment: Goal: General experience of comfort will improve and/or be controlled Outcome: Progressing

## 2023-06-01 NOTE — Progress Notes (Addendum)
 Patient up walking in room. Advised to slowly increase activity. 0800 Cardiology in to assess patient. 1030 Discharge teaching done. Questions answered. 1045 Patient discharged home with wife.

## 2023-06-06 DIAGNOSIS — R1032 Left lower quadrant pain: Secondary | ICD-10-CM | POA: Diagnosis not present

## 2023-06-06 DIAGNOSIS — R079 Chest pain, unspecified: Secondary | ICD-10-CM | POA: Diagnosis not present

## 2023-06-06 DIAGNOSIS — Z955 Presence of coronary angioplasty implant and graft: Secondary | ICD-10-CM | POA: Diagnosis not present

## 2023-06-06 DIAGNOSIS — I2510 Atherosclerotic heart disease of native coronary artery without angina pectoris: Secondary | ICD-10-CM | POA: Diagnosis not present

## 2023-06-06 DIAGNOSIS — Z8249 Family history of ischemic heart disease and other diseases of the circulatory system: Secondary | ICD-10-CM | POA: Diagnosis not present

## 2023-06-06 DIAGNOSIS — I209 Angina pectoris, unspecified: Secondary | ICD-10-CM | POA: Diagnosis not present

## 2023-06-06 DIAGNOSIS — E6609 Other obesity due to excess calories: Secondary | ICD-10-CM | POA: Diagnosis not present

## 2023-06-06 DIAGNOSIS — R9439 Abnormal result of other cardiovascular function study: Secondary | ICD-10-CM | POA: Diagnosis not present

## 2023-06-06 DIAGNOSIS — Z6830 Body mass index (BMI) 30.0-30.9, adult: Secondary | ICD-10-CM | POA: Diagnosis not present

## 2023-06-06 DIAGNOSIS — E66811 Obesity, class 1: Secondary | ICD-10-CM | POA: Diagnosis not present

## 2023-06-06 DIAGNOSIS — E782 Mixed hyperlipidemia: Secondary | ICD-10-CM | POA: Diagnosis not present

## 2023-06-06 DIAGNOSIS — I10 Essential (primary) hypertension: Secondary | ICD-10-CM | POA: Diagnosis not present

## 2023-06-08 ENCOUNTER — Encounter: Payer: Self-pay | Admitting: Internal Medicine

## 2023-06-11 DIAGNOSIS — D2262 Melanocytic nevi of left upper limb, including shoulder: Secondary | ICD-10-CM | POA: Diagnosis not present

## 2023-06-11 DIAGNOSIS — D2261 Melanocytic nevi of right upper limb, including shoulder: Secondary | ICD-10-CM | POA: Diagnosis not present

## 2023-06-11 DIAGNOSIS — D225 Melanocytic nevi of trunk: Secondary | ICD-10-CM | POA: Diagnosis not present

## 2023-06-11 DIAGNOSIS — D2271 Melanocytic nevi of right lower limb, including hip: Secondary | ICD-10-CM | POA: Diagnosis not present

## 2023-06-11 DIAGNOSIS — L570 Actinic keratosis: Secondary | ICD-10-CM | POA: Diagnosis not present

## 2023-06-11 DIAGNOSIS — Z85828 Personal history of other malignant neoplasm of skin: Secondary | ICD-10-CM | POA: Diagnosis not present

## 2023-06-11 DIAGNOSIS — Z08 Encounter for follow-up examination after completed treatment for malignant neoplasm: Secondary | ICD-10-CM | POA: Diagnosis not present

## 2023-06-11 DIAGNOSIS — L821 Other seborrheic keratosis: Secondary | ICD-10-CM | POA: Diagnosis not present

## 2023-06-11 DIAGNOSIS — D2272 Melanocytic nevi of left lower limb, including hip: Secondary | ICD-10-CM | POA: Diagnosis not present

## 2023-06-12 ENCOUNTER — Other Ambulatory Visit: Payer: Self-pay | Admitting: Internal Medicine

## 2023-06-12 DIAGNOSIS — R1032 Left lower quadrant pain: Secondary | ICD-10-CM

## 2023-06-13 ENCOUNTER — Ambulatory Visit
Admission: RE | Admit: 2023-06-13 | Discharge: 2023-06-13 | Disposition: A | Discharge status: Home / Self Care | Source: Ambulatory Visit | Attending: Internal Medicine | Admitting: Internal Medicine

## 2023-06-13 DIAGNOSIS — R1032 Left lower quadrant pain: Secondary | ICD-10-CM | POA: Diagnosis not present

## 2023-06-13 DIAGNOSIS — S7012XA Contusion of left thigh, initial encounter: Secondary | ICD-10-CM | POA: Diagnosis not present

## 2023-07-06 DIAGNOSIS — E66811 Obesity, class 1: Secondary | ICD-10-CM | POA: Diagnosis not present

## 2023-07-06 DIAGNOSIS — I2510 Atherosclerotic heart disease of native coronary artery without angina pectoris: Secondary | ICD-10-CM | POA: Diagnosis not present

## 2023-07-06 DIAGNOSIS — Z955 Presence of coronary angioplasty implant and graft: Secondary | ICD-10-CM | POA: Diagnosis not present

## 2023-07-06 DIAGNOSIS — E782 Mixed hyperlipidemia: Secondary | ICD-10-CM | POA: Diagnosis not present

## 2023-07-06 DIAGNOSIS — R9439 Abnormal result of other cardiovascular function study: Secondary | ICD-10-CM | POA: Diagnosis not present

## 2023-07-06 DIAGNOSIS — I10 Essential (primary) hypertension: Secondary | ICD-10-CM | POA: Diagnosis not present

## 2023-07-06 DIAGNOSIS — Z6830 Body mass index (BMI) 30.0-30.9, adult: Secondary | ICD-10-CM | POA: Diagnosis not present

## 2023-07-06 DIAGNOSIS — I252 Old myocardial infarction: Secondary | ICD-10-CM | POA: Diagnosis not present

## 2023-07-06 DIAGNOSIS — I209 Angina pectoris, unspecified: Secondary | ICD-10-CM | POA: Diagnosis not present

## 2023-07-06 DIAGNOSIS — R079 Chest pain, unspecified: Secondary | ICD-10-CM | POA: Diagnosis not present

## 2023-07-06 DIAGNOSIS — E6609 Other obesity due to excess calories: Secondary | ICD-10-CM | POA: Diagnosis not present

## 2023-07-09 DIAGNOSIS — E782 Mixed hyperlipidemia: Secondary | ICD-10-CM | POA: Diagnosis not present

## 2023-07-09 DIAGNOSIS — Z0000 Encounter for general adult medical examination without abnormal findings: Secondary | ICD-10-CM | POA: Diagnosis not present

## 2023-07-09 DIAGNOSIS — Z1331 Encounter for screening for depression: Secondary | ICD-10-CM | POA: Diagnosis not present

## 2023-07-09 DIAGNOSIS — M791 Myalgia, unspecified site: Secondary | ICD-10-CM | POA: Diagnosis not present

## 2023-07-09 DIAGNOSIS — I2510 Atherosclerotic heart disease of native coronary artery without angina pectoris: Secondary | ICD-10-CM | POA: Diagnosis not present

## 2023-07-09 DIAGNOSIS — R972 Elevated prostate specific antigen [PSA]: Secondary | ICD-10-CM | POA: Diagnosis not present

## 2023-07-09 DIAGNOSIS — R739 Hyperglycemia, unspecified: Secondary | ICD-10-CM | POA: Diagnosis not present

## 2023-07-09 DIAGNOSIS — T466X5A Adverse effect of antihyperlipidemic and antiarteriosclerotic drugs, initial encounter: Secondary | ICD-10-CM | POA: Diagnosis not present

## 2023-07-09 DIAGNOSIS — D735 Infarction of spleen: Secondary | ICD-10-CM | POA: Diagnosis not present

## 2023-08-20 DIAGNOSIS — H0288B Meibomian gland dysfunction left eye, upper and lower eyelids: Secondary | ICD-10-CM | POA: Diagnosis not present

## 2023-08-20 DIAGNOSIS — L718 Other rosacea: Secondary | ICD-10-CM | POA: Diagnosis not present

## 2023-08-20 DIAGNOSIS — H16223 Keratoconjunctivitis sicca, not specified as Sjogren's, bilateral: Secondary | ICD-10-CM | POA: Diagnosis not present

## 2023-08-20 DIAGNOSIS — H43813 Vitreous degeneration, bilateral: Secondary | ICD-10-CM | POA: Diagnosis not present

## 2023-08-20 DIAGNOSIS — H0288A Meibomian gland dysfunction right eye, upper and lower eyelids: Secondary | ICD-10-CM | POA: Diagnosis not present

## 2023-10-11 DIAGNOSIS — I252 Old myocardial infarction: Secondary | ICD-10-CM | POA: Diagnosis not present

## 2023-10-11 DIAGNOSIS — E66811 Obesity, class 1: Secondary | ICD-10-CM | POA: Diagnosis not present

## 2023-10-11 DIAGNOSIS — E6609 Other obesity due to excess calories: Secondary | ICD-10-CM | POA: Diagnosis not present

## 2023-10-11 DIAGNOSIS — I2510 Atherosclerotic heart disease of native coronary artery without angina pectoris: Secondary | ICD-10-CM | POA: Diagnosis not present

## 2023-10-11 DIAGNOSIS — E782 Mixed hyperlipidemia: Secondary | ICD-10-CM | POA: Diagnosis not present

## 2023-10-11 DIAGNOSIS — I10 Essential (primary) hypertension: Secondary | ICD-10-CM | POA: Diagnosis not present

## 2023-10-11 DIAGNOSIS — R079 Chest pain, unspecified: Secondary | ICD-10-CM | POA: Diagnosis not present

## 2023-10-11 DIAGNOSIS — Z955 Presence of coronary angioplasty implant and graft: Secondary | ICD-10-CM | POA: Diagnosis not present

## 2023-10-11 DIAGNOSIS — Z6830 Body mass index (BMI) 30.0-30.9, adult: Secondary | ICD-10-CM | POA: Diagnosis not present

## 2023-10-16 DIAGNOSIS — I2510 Atherosclerotic heart disease of native coronary artery without angina pectoris: Secondary | ICD-10-CM | POA: Diagnosis not present

## 2023-10-16 DIAGNOSIS — J40 Bronchitis, not specified as acute or chronic: Secondary | ICD-10-CM | POA: Diagnosis not present

## 2023-10-16 DIAGNOSIS — J4522 Mild intermittent asthma with status asthmaticus: Secondary | ICD-10-CM | POA: Diagnosis not present

## 2023-10-23 DIAGNOSIS — H16223 Keratoconjunctivitis sicca, not specified as Sjogren's, bilateral: Secondary | ICD-10-CM | POA: Diagnosis not present

## 2023-10-23 DIAGNOSIS — L718 Other rosacea: Secondary | ICD-10-CM | POA: Diagnosis not present

## 2023-10-23 DIAGNOSIS — H0288A Meibomian gland dysfunction right eye, upper and lower eyelids: Secondary | ICD-10-CM | POA: Diagnosis not present

## 2023-10-23 DIAGNOSIS — H0288B Meibomian gland dysfunction left eye, upper and lower eyelids: Secondary | ICD-10-CM | POA: Diagnosis not present

## 2023-10-23 DIAGNOSIS — H43813 Vitreous degeneration, bilateral: Secondary | ICD-10-CM | POA: Diagnosis not present

## 2023-12-03 DIAGNOSIS — M65912 Unspecified synovitis and tenosynovitis, left shoulder: Secondary | ICD-10-CM | POA: Diagnosis not present

## 2023-12-03 DIAGNOSIS — Z23 Encounter for immunization: Secondary | ICD-10-CM | POA: Diagnosis not present

## 2023-12-03 DIAGNOSIS — I2510 Atherosclerotic heart disease of native coronary artery without angina pectoris: Secondary | ICD-10-CM | POA: Diagnosis not present

## 2023-12-11 DIAGNOSIS — D2261 Melanocytic nevi of right upper limb, including shoulder: Secondary | ICD-10-CM | POA: Diagnosis not present

## 2023-12-11 DIAGNOSIS — D2271 Melanocytic nevi of right lower limb, including hip: Secondary | ICD-10-CM | POA: Diagnosis not present

## 2023-12-11 DIAGNOSIS — L570 Actinic keratosis: Secondary | ICD-10-CM | POA: Diagnosis not present

## 2023-12-11 DIAGNOSIS — D0461 Carcinoma in situ of skin of right upper limb, including shoulder: Secondary | ICD-10-CM | POA: Diagnosis not present

## 2023-12-11 DIAGNOSIS — C44311 Basal cell carcinoma of skin of nose: Secondary | ICD-10-CM | POA: Diagnosis not present

## 2023-12-11 DIAGNOSIS — D485 Neoplasm of uncertain behavior of skin: Secondary | ICD-10-CM | POA: Diagnosis not present

## 2023-12-11 DIAGNOSIS — D2272 Melanocytic nevi of left lower limb, including hip: Secondary | ICD-10-CM | POA: Diagnosis not present

## 2023-12-11 DIAGNOSIS — Z85828 Personal history of other malignant neoplasm of skin: Secondary | ICD-10-CM | POA: Diagnosis not present

## 2023-12-11 DIAGNOSIS — D2262 Melanocytic nevi of left upper limb, including shoulder: Secondary | ICD-10-CM | POA: Diagnosis not present

## 2024-01-03 DIAGNOSIS — E782 Mixed hyperlipidemia: Secondary | ICD-10-CM | POA: Diagnosis not present

## 2024-01-03 DIAGNOSIS — R739 Hyperglycemia, unspecified: Secondary | ICD-10-CM | POA: Diagnosis not present

## 2024-01-03 DIAGNOSIS — R972 Elevated prostate specific antigen [PSA]: Secondary | ICD-10-CM | POA: Diagnosis not present

## 2024-01-10 DIAGNOSIS — Z125 Encounter for screening for malignant neoplasm of prostate: Secondary | ICD-10-CM | POA: Diagnosis not present

## 2024-01-10 DIAGNOSIS — I2510 Atherosclerotic heart disease of native coronary artery without angina pectoris: Secondary | ICD-10-CM | POA: Diagnosis not present

## 2024-01-10 DIAGNOSIS — E538 Deficiency of other specified B group vitamins: Secondary | ICD-10-CM | POA: Diagnosis not present

## 2024-01-10 DIAGNOSIS — Z79899 Other long term (current) drug therapy: Secondary | ICD-10-CM | POA: Diagnosis not present

## 2024-01-10 DIAGNOSIS — E782 Mixed hyperlipidemia: Secondary | ICD-10-CM | POA: Diagnosis not present

## 2024-01-10 DIAGNOSIS — R739 Hyperglycemia, unspecified: Secondary | ICD-10-CM | POA: Diagnosis not present

## 2024-01-31 DIAGNOSIS — D0461 Carcinoma in situ of skin of right upper limb, including shoulder: Secondary | ICD-10-CM | POA: Diagnosis not present
